# Patient Record
Sex: Female | Born: 1985 | Race: Black or African American | Hispanic: No | Marital: Single | Smoking: Never smoker
Health system: Southern US, Community
[De-identification: ages and names within clinical notes are randomized; demographics above are authoritative.]

## PROBLEM LIST (undated history)

## (undated) ENCOUNTER — Inpatient Hospital Stay (HOSPITAL_COMMUNITY): Payer: Self-pay

## (undated) DIAGNOSIS — A5901 Trichomonal vulvovaginitis: Secondary | ICD-10-CM

## (undated) DIAGNOSIS — K047 Periapical abscess without sinus: Secondary | ICD-10-CM

## (undated) DIAGNOSIS — O24419 Gestational diabetes mellitus in pregnancy, unspecified control: Secondary | ICD-10-CM

## (undated) DIAGNOSIS — R87629 Unspecified abnormal cytological findings in specimens from vagina: Secondary | ICD-10-CM

## (undated) HISTORY — PX: DILATION AND EVACUATION: SHX1459

## (undated) HISTORY — PX: COLPOSCOPY: SHX161

---

## 2004-02-09 ENCOUNTER — Emergency Department (HOSPITAL_COMMUNITY): Admission: EM | Admit: 2004-02-09 | Discharge: 2004-02-09 | Payer: Self-pay | Admitting: Emergency Medicine

## 2005-04-01 ENCOUNTER — Inpatient Hospital Stay (HOSPITAL_COMMUNITY): Admission: AD | Admit: 2005-04-01 | Discharge: 2005-04-01 | Payer: Self-pay | Admitting: Obstetrics & Gynecology

## 2005-04-07 ENCOUNTER — Inpatient Hospital Stay (HOSPITAL_COMMUNITY): Admission: AD | Admit: 2005-04-07 | Discharge: 2005-04-07 | Payer: Self-pay | Admitting: Obstetrics and Gynecology

## 2009-01-21 ENCOUNTER — Encounter: Admission: RE | Admit: 2009-01-21 | Discharge: 2009-01-21 | Payer: Self-pay | Admitting: Family Medicine

## 2009-10-28 ENCOUNTER — Emergency Department (HOSPITAL_COMMUNITY): Admission: EM | Admit: 2009-10-28 | Discharge: 2009-10-28 | Payer: Self-pay | Admitting: Emergency Medicine

## 2010-01-23 ENCOUNTER — Emergency Department (HOSPITAL_COMMUNITY): Admission: EM | Admit: 2010-01-23 | Discharge: 2010-01-24 | Payer: Self-pay | Admitting: Emergency Medicine

## 2010-01-25 ENCOUNTER — Emergency Department (HOSPITAL_COMMUNITY): Admission: EM | Admit: 2010-01-25 | Discharge: 2010-01-25 | Payer: Self-pay | Admitting: Emergency Medicine

## 2010-01-28 ENCOUNTER — Emergency Department (HOSPITAL_COMMUNITY): Admission: EM | Admit: 2010-01-28 | Discharge: 2010-01-28 | Payer: Self-pay | Admitting: Emergency Medicine

## 2010-02-26 ENCOUNTER — Encounter: Admission: RE | Admit: 2010-02-26 | Discharge: 2010-02-26 | Payer: Self-pay | Admitting: Family Medicine

## 2010-05-07 ENCOUNTER — Emergency Department (HOSPITAL_COMMUNITY): Admission: EM | Admit: 2010-05-07 | Discharge: 2010-05-07 | Payer: Self-pay | Admitting: Emergency Medicine

## 2010-10-18 ENCOUNTER — Encounter: Payer: Self-pay | Admitting: Family Medicine

## 2011-01-20 ENCOUNTER — Emergency Department (HOSPITAL_COMMUNITY)
Admission: EM | Admit: 2011-01-20 | Discharge: 2011-01-20 | Disposition: A | Discharge status: Home / Self Care | Payer: Self-pay | Attending: Emergency Medicine | Admitting: Emergency Medicine

## 2011-01-20 DIAGNOSIS — K644 Residual hemorrhoidal skin tags: Secondary | ICD-10-CM | POA: Insufficient documentation

## 2011-02-09 ENCOUNTER — Other Ambulatory Visit: Payer: Self-pay | Admitting: Family Medicine

## 2011-02-09 DIAGNOSIS — Z09 Encounter for follow-up examination after completed treatment for conditions other than malignant neoplasm: Secondary | ICD-10-CM

## 2011-03-19 ENCOUNTER — Ambulatory Visit
Admission: RE | Admit: 2011-03-19 | Discharge: 2011-03-19 | Disposition: A | Discharge status: Home / Self Care | Payer: Self-pay | Source: Ambulatory Visit | Attending: Family Medicine | Admitting: Family Medicine

## 2011-03-19 DIAGNOSIS — Z09 Encounter for follow-up examination after completed treatment for conditions other than malignant neoplasm: Secondary | ICD-10-CM

## 2011-04-06 ENCOUNTER — Other Ambulatory Visit (INDEPENDENT_AMBULATORY_CARE_PROVIDER_SITE_OTHER): Payer: Self-pay | Admitting: General Surgery

## 2011-04-06 DIAGNOSIS — Z1231 Encounter for screening mammogram for malignant neoplasm of breast: Secondary | ICD-10-CM

## 2011-06-25 ENCOUNTER — Encounter (HOSPITAL_COMMUNITY): Payer: Self-pay | Admitting: *Deleted

## 2011-06-25 ENCOUNTER — Inpatient Hospital Stay (HOSPITAL_COMMUNITY)
Admission: AD | Admit: 2011-06-25 | Discharge: 2011-06-26 | Disposition: A | Discharge status: Home / Self Care | Payer: Self-pay | Source: Ambulatory Visit | Attending: Obstetrics & Gynecology | Admitting: Obstetrics & Gynecology

## 2011-06-25 DIAGNOSIS — Z369 Encounter for antenatal screening, unspecified: Secondary | ICD-10-CM

## 2011-06-25 DIAGNOSIS — O219 Vomiting of pregnancy, unspecified: Secondary | ICD-10-CM

## 2011-06-25 DIAGNOSIS — O210 Mild hyperemesis gravidarum: Secondary | ICD-10-CM | POA: Insufficient documentation

## 2011-06-25 DIAGNOSIS — Z36 Encounter for antenatal screening of mother: Secondary | ICD-10-CM

## 2011-06-25 HISTORY — DX: Periapical abscess without sinus: K04.7

## 2011-06-25 LAB — URINALYSIS, ROUTINE W REFLEX MICROSCOPIC
Bilirubin Urine: NEGATIVE
Glucose, UA: NEGATIVE mg/dL
Hgb urine dipstick: NEGATIVE
Leukocytes, UA: NEGATIVE

## 2011-06-25 NOTE — ED Provider Notes (Signed)
History     CSN: [id number] Arrival date & time: 06/25/2011 10:29 PM  Chief Complaint  Patient presents with  . Abdominal Pain  . Nausea  . Dizziness    HPI Julia Owens is a 25 y.o. female who presents to [facility] for cramping abdominal pain, nausea and dizziness. LMP in August was normal, no period in Sept. Breast tenderness past several days. Stopped  OC in April. The history was provided by the patient.  Past Medical History  Diagnosis Date  . Abscessed tooth     current    History reviewed. No pertinent past surgical history.  No family history on file.  History  Substance Use Topics  . Smoking status: Never Smoker   . Smokeless tobacco: Never Used  . Alcohol Use: Yes     socially drinks    OB History    Grav Para Term Preterm Abortions TAB SAB Ect Mult Living   2 0 0 0 1 0 1 0 0 0       Review of Systems  Constitutional: Positive for chills and fatigue. Negative for fever and diaphoresis.  HENT: Negative for ear pain, congestion, sore throat, facial swelling, neck pain, neck stiffness, dental problem and sinus pressure.   Eyes: Negative for photophobia, pain and discharge.  Respiratory: Negative for cough, chest tightness and wheezing.   Cardiovascular: Negative.   Gastrointestinal: Positive for nausea and abdominal pain. Negative for vomiting, diarrhea and constipation.  Genitourinary: Positive for frequency. Negative for dysuria, flank pain and difficulty urinating.  Musculoskeletal: Positive for back pain. Negative for myalgias and gait problem.  Skin: Negative for color change and rash.  Neurological: Positive for light-headedness. Negative for dizziness, speech difficulty, weakness, numbness and headaches.  Psychiatric/Behavioral: Negative for confusion and agitation.    Allergies  Review of patient's allergies indicates no known allergies.  Home Medications  No current outpatient prescriptions on file.  BP 115/81  Pulse 94  Temp(Src) 99.1 F (37.3  C) (Oral)  Ht 5\' 3"  (1.6 m)  Wt 184 lb 6 oz (83.632 kg)  BMI 32.66 kg/m2  LMP 05/12/2011  Physical Exam  Nursing note and vitals reviewed. Constitutional: She is oriented to person, place, and time. She appears well-developed and well-nourished.  HENT:  Head: Normocephalic and atraumatic.  Eyes: EOM are normal.  Neck: Neck supple.  Pulmonary/Chest: Effort normal.  Abdominal: Soft. There is no tenderness.       Unable to reproduce the pain the patient has had.  Genitourinary:       White vaginal discharge. Cervix closed, long, no CMT, no adnexal mass or tenderness. Uterus slightly enlarged.  Musculoskeletal: Normal range of motion.  Neurological: She is alert and oriented to person, place, and time. No cranial nerve deficit.  Skin: Skin is warm and dry.    ED Course  Procedures US Ob Comp Less 14 Wks  06/26/2011  *RADIOLOGY REPORT*  Clinical Data: Abdominal pain and cramping.  Positive pregnancy test.  Estimated gestational age by LMP 6 weeks 3 days.  OBSTETRIC <14 WK ULTRASOUND  Technique:  Transabdominal ultrasound was performed for evaluation of the gestation as well as the maternal uterus and adnexal regions.  Comparison:  None.  Intrauterine gestational sac: The uterus is anteverted.  A single intrauterine gestational sac is visualized. Yolk sac: Demonstrated Embryo: Demonstrated Cardiac Activity: Demonstrated Heart Rate: 128 bpm  CRL:  10 mm  7w  1d           [place] EDC: 02/11/2012  Maternal uterus/Adnexae:  The right ovary is not visualized.  The left ovary is demonstrated and contains a hypoechoic structure measuring about 3.1 x 2.4 x 2.7 cm consistent with a hemorrhagic cyst.  No free pelvic fluid collections.  IMPRESSION: Single intrauterine gestation with crown-rump length consistent with estimated gestational age [redacted] weeks 1 day.  Original Report Authenticated By: Marlon Pel, M.D.   Results for orders placed during the hospital encounter of 06/25/11 (from the past 24 hour(s))    URINALYSIS, ROUTINE W REFLEX MICROSCOPIC     Status: Normal   Collection Time   06/25/11 11:15 PM      Component Value Range   Color, Urine YELLOW  YELLOW    Appearance CLEAR  CLEAR    Specific Gravity, Urine 1.025  1.005 - 1.030    pH 6.5  5.0 - 8.0    Glucose, UA NEGATIVE  NEGATIVE (mg/dL)   Hgb urine dipstick NEGATIVE  NEGATIVE    Bilirubin Urine NEGATIVE  NEGATIVE    Ketones, ur NEGATIVE  NEGATIVE (mg/dL)   Protein, ur NEGATIVE  NEGATIVE (mg/dL)   Urobilinogen, UA 0.2  0.0 - 1.0 (mg/dL)   Nitrite NEGATIVE  NEGATIVE    Leukocytes, UA NEGATIVE  NEGATIVE   POCT PREGNANCY, URINE     Status: Normal   Collection Time   06/25/11 11:27 PM      Component Value Range   Preg Test, Ur POSITIVE    WET PREP, GENITAL     Status: Abnormal   Collection Time   06/25/11 11:45 PM      Component Value Range   Yeast, Wet Prep NONE SEEN  NONE SEEN    Trich, Wet Prep NONE SEEN  NONE SEEN    Clue Cells, Wet Prep FEW (*) NONE SEEN    WBC, Wet Prep HPF POC MODERATE (*) NONE SEEN   CBC     Status: Abnormal   Collection Time   06/26/11 12:18 AM      Component Value Range   WBC 16.9 (*) 4.0 - 10.5 (K/uL)   RBC 4.29  3.87 - 5.11 (MIL/uL)   Hemoglobin 11.6 (*) 12.0 - 15.0 (g/dL)   HCT 16.1 (*) 09.6 - 46.0 (%)   MCV 76.7 (*) 78.0 - 100.0 (fL)   MCH 27.0  26.0 - 34.0 (pg)   MCHC 35.3  30.0 - 36.0 (g/dL)   RDW 04.5  40.9 - 81.1 (%)   Platelets 287  150 - 400 (K/uL)  DIFFERENTIAL     Status: Abnormal   Collection Time   06/26/11 12:18 AM      Component Value Range   Neutrophils Relative 76  43 - 77 (%)   Neutro Abs 12.8 (*) 1.7 - 7.7 (K/uL)   Lymphocytes Relative 18  12 - 46 (%)   Lymphs Abs 3.0  0.7 - 4.0 (K/uL)   Monocytes Relative 6  3 - 12 (%)   Monocytes Absolute 1.0  0.1 - 1.0 (K/uL)   Eosinophils Relative 0  0 - 5 (%)   Eosinophils Absolute 0.1  0.0 - 0.7 (K/uL)   Basophils Relative 0  0 - 1 (%)   Basophils Absolute 0.0  0.0 - 0.1 (K/uL)   Discussed elevated WBC with Dr. Marice Potter.   Assessment: 7 week 1 day IUP with cardiac activity   Nausea in early pregnancy  Plan:  Phenergan for nausea   Start prenatal care   Return for problems.  MDM  [place], [place] 06/26/11 [phone]

## 2011-06-25 NOTE — Progress Notes (Signed)
[name] NP in to see pt. Spec exam done and wet prep and GC/Chlam obtained. Pt tol well 

## 2011-06-25 NOTE — Progress Notes (Signed)
Pt states, " I've had pain in my low abdomen for 2 wks with white vaginal discharge that has been off and on for 2 wks. I've also had nausea and dizziness off and on for 2 wks."

## 2011-06-26 ENCOUNTER — Inpatient Hospital Stay (HOSPITAL_COMMUNITY): Payer: Self-pay

## 2011-06-26 LAB — CBC
HCT: 32.9 % — ABNORMAL LOW (ref 36.0–46.0)
Hemoglobin: 11.6 g/dL — ABNORMAL LOW (ref 12.0–15.0)
MCH: 27 pg (ref 26.0–34.0)
MCHC: 35.3 g/dL (ref 30.0–36.0)
MCV: 76.7 fL — ABNORMAL LOW (ref 78.0–100.0)
WBC: 16.9 10*3/uL — ABNORMAL HIGH (ref 4.0–10.5)

## 2011-06-26 LAB — DIFFERENTIAL
Basophils Absolute: 0 10*3/uL (ref 0.0–0.1)
Basophils Relative: 0 % (ref 0–1)
Eosinophils Absolute: 0.1 10*3/uL (ref 0.0–0.7)
Eosinophils Relative: 0 % (ref 0–5)
Neutro Abs: 12.8 10*3/uL — ABNORMAL HIGH (ref 1.7–7.7)

## 2011-06-26 LAB — WET PREP, GENITAL
Trich, Wet Prep: NONE SEEN
Yeast Wet Prep HPF POC: NONE SEEN

## 2011-06-26 LAB — HCG, QUANTITATIVE, PREGNANCY: hCG, Beta Chain, Quant, S: 84027 m[IU]/mL — ABNORMAL HIGH (ref <5)

## 2011-06-26 LAB — ABO/RH: ABO/RH(D): A POS

## 2011-06-26 LAB — GC/CHLAMYDIA PROBE AMP, GENITAL: Chlamydia, DNA Probe: NEGATIVE

## 2011-06-26 MED ORDER — PROMETHAZINE HCL 25 MG PO TABS: 25.0000 mg | ORAL_TABLET | Freq: Four times a day (QID) | ORAL | Status: DC | PRN

## 2011-06-26 NOTE — ED Notes (Signed)
0115 [name] NP in to discuss lab results and d/c plan with pt

## 2011-06-26 NOTE — Progress Notes (Signed)
Written and verbal d/c instructions given and understanding voiced. 

## 2011-06-26 NOTE — ED Notes (Signed)
Pt to and from u/s ambulatory

## 2011-06-26 NOTE — ED Provider Notes (Signed)
Agree with above note.  Julia Owens H. 06/26/2011 9:12 AM

## 2011-10-26 ENCOUNTER — Encounter (HOSPITAL_COMMUNITY): Payer: Self-pay | Admitting: Emergency Medicine

## 2011-10-26 ENCOUNTER — Emergency Department (HOSPITAL_COMMUNITY)
Admission: EM | Admit: 2011-10-26 | Discharge: 2011-10-27 | Disposition: A | Discharge status: Home / Self Care | Payer: Self-pay | Attending: Emergency Medicine | Admitting: Emergency Medicine

## 2011-10-26 DIAGNOSIS — L02219 Cutaneous abscess of trunk, unspecified: Secondary | ICD-10-CM | POA: Insufficient documentation

## 2011-10-26 DIAGNOSIS — L0291 Cutaneous abscess, unspecified: Secondary | ICD-10-CM

## 2011-10-26 NOTE — ED Notes (Signed)
Pt states she has an abscess to her lower back  Pt states it started on Thursday and it burst last night and she soaked in epsom salt  Today she states it is worse  Pt states she was seen at urgent care yesterday but the pain is worse today

## 2011-10-27 NOTE — ED Provider Notes (Signed)
History     CSN: [id number]  Arrival date & time 10/26/11  2102   First MD Initiated Contact with Patient 10/27/11 0034      Chief Complaint  Patient presents with  . Abscess    (Consider location/radiation/quality/duration/timing/severity/associated sxs/prior treatment) Patient is a 26 y.o. female presenting with abscess. The history is provided by the patient.  Abscess  This is a new problem. The current episode started less than one week ago. The onset was gradual. The problem occurs continuously. The problem has been gradually worsening. The abscess is present on the back. The problem is moderate. The abscess is characterized by painfulness and draining. Pertinent negatives include no fever. Recently, medical care has been given at another facility. Services received include medications given.   Pt has hx of abscesses in the past which have required drainage. Presents with abscess to R low back. States she was seen at urgent care yesterday and started on clindamycin for this; the abscess was not I+Ded. It spontaneously burst last evening and she has soaked it in epsom salt. She presents today as it is more painful.  Past Medical History  Diagnosis Date  . Abscessed tooth     current    History reviewed. No pertinent past surgical history.  History reviewed. No pertinent family history.  History  Substance Use Topics  . Smoking status: Never Smoker   . Smokeless tobacco: Never Used  . Alcohol Use: Yes     socially drinks    OB History    Grav Para Term Preterm Abortions TAB SAB Ect Mult Living   2 0 0 0 1 0 1 0 0 0       Review of Systems  Constitutional: Negative for fever.  Gastrointestinal: Negative for nausea.  Musculoskeletal: Negative for back pain.  Skin: Positive for wound.    Allergies  Review of patient's allergies indicates no known allergies.  Home Medications   Current Outpatient Rx  Name Route Sig Dispense Refill  . CEPHALEXIN 500 MG PO CAPS  Oral Take 500 mg by mouth 3 (three) times daily.    Marland Kitchen FERROUS FUMARATE 325 (106 FE) MG PO TABS Oral Take 1 tablet by mouth daily.    . IBUPROFEN 800 MG PO TABS Oral Take 800 mg by mouth every 8 (eight) hours as needed. Pain    . ADULT MULTIVITAMIN W/MINERALS CH Oral Take 1 tablet by mouth daily.    Marland Kitchen CLINDAMYCIN HCL 300 MG PO CAPS Oral Take 300 mg by mouth every 6 (six) hours.        BP 133/82  Pulse 109  Temp(Src) 99 F (37.2 C) (Oral)  Resp 20  SpO2 99%  LMP 10/26/2011  Breastfeeding? Unknown  Physical Exam  Vitals reviewed. Constitutional: She is oriented to person, place, and time. She appears well-developed and well-nourished. No distress.  HENT:  Head: Normocephalic and atraumatic.  Neck: Normal range of motion.  Cardiovascular: Normal rate.   Pulmonary/Chest: Effort normal.  Musculoskeletal: Normal range of motion.  Neurological: She is alert and oriented to person, place, and time.  Skin: Skin is warm and dry. She is not diaphoretic.       Abscess draining purulent material to R low back; fluctuant and painful to palpation; no overlying cellulitis  Psychiatric: She has a normal mood and affect.    ED Course  Procedures (including critical care time)  INCISION AND DRAINAGE Performed by: Grant Fontana Consent: Verbal consent obtained. Risks and benefits: risks, benefits and alternatives  were discussed Type: abscess  Body area: R lower back  Anesthesia: local infiltration  Local anesthetic: lidocaine 2% with epinephrine  Anesthetic total: 4 ml  Complexity: complex Blunt dissection to break up loculations  Drainage: purulent  Drainage amount: moderate  Packing material: 1/4 in iodoform gauze  Patient tolerance: Patient tolerated the procedure well with no immediate complications.     Labs Reviewed - No data to display No results found.   1. Abscess       MDM  Pt with abscess to R low back which has been draining. Incision and drainage  with blunt dissection undertaken to facilitate further drainage. Wound packed. Pt instructed to finish clindamycin as rxed at urgent care and to return in 2 days for wound recheck. Return precautions discussed.       Grant Fontana, [place] 10/28/11 1159

## 2011-10-29 NOTE — ED Provider Notes (Signed)
Medical screening examination/treatment/procedure(s) were performed by non-physician practitioner and as supervising physician I was immediately available for consultation/collaboration.  Loren Racer, MD 10/29/11 [phone]

## 2012-01-09 ENCOUNTER — Emergency Department (HOSPITAL_COMMUNITY)
Admission: EM | Admit: 2012-01-09 | Discharge: 2012-01-10 | Disposition: A | Discharge status: Home / Self Care | Payer: Self-pay | Attending: Emergency Medicine | Admitting: Emergency Medicine

## 2012-01-09 DIAGNOSIS — L0390 Cellulitis, unspecified: Secondary | ICD-10-CM

## 2012-01-09 DIAGNOSIS — L02219 Cutaneous abscess of trunk, unspecified: Secondary | ICD-10-CM | POA: Insufficient documentation

## 2012-01-09 DIAGNOSIS — K047 Periapical abscess without sinus: Secondary | ICD-10-CM | POA: Insufficient documentation

## 2012-01-10 ENCOUNTER — Encounter (HOSPITAL_COMMUNITY): Payer: Self-pay

## 2012-01-10 MED ORDER — AMOXICILLIN-POT CLAVULANATE 875-125 MG PO TABS: 1.0000 | ORAL_TABLET | Freq: Two times a day (BID) | ORAL | Status: AC

## 2012-01-10 MED ORDER — IBUPROFEN 600 MG PO TABS: 600.0000 mg | ORAL_TABLET | Freq: Four times a day (QID) | ORAL | Status: AC | PRN

## 2012-01-10 NOTE — Discharge Instructions (Signed)

## 2012-01-10 NOTE — ED Provider Notes (Signed)
History     CSN: [id number]  Arrival date & time 01/09/12  2354   First MD Initiated Contact with Patient 01/10/12 [phone]      Chief Complaint  Patient presents with  . Cellulitis    (Consider location/radiation/quality/duration/timing/severity/associated sxs/prior treatment) HPI Comments: Patient here after having a new belly button rings placed 2 days ago. States increase in redness surrounding the area and has noticed drainage from the area. She went back to the piercer who removed the ring, and told her to use sea. salt and water to wash the wound at home. She reports continued periodic drainage on the site, redness, swelling. Denies fevers chills, nausea, vomiting.  Patient is a 26 y.o. female presenting with rash. The history is provided by the patient. No language interpreter was used.  Rash  This is a new problem. The current episode started more than 2 days ago. The problem has been gradually worsening. Associated with: new body piercing. There has been no fever. Affected Location: umbilicus. The pain is at a severity of 5/10. The pain is moderate. The pain has been constant since onset. Associated symptoms include itching, pain and weeping. Pertinent negatives include no blisters. She has tried OTC analgesics for the symptoms. The treatment provided no relief.    Past Medical History  Diagnosis Date  . Abscessed tooth     current    No past surgical history on file.  History reviewed. No pertinent family history.  History  Substance Use Topics  . Smoking status: Never Smoker   . Smokeless tobacco: Never Used  . Alcohol Use: Yes     socially drinks    OB History    Grav Para Term Preterm Abortions TAB SAB Ect Mult Living   2 0 0 0 1 0 1 0 0 0       Review of Systems  Skin: Positive for itching, rash and wound.  All other systems reviewed and are negative.    Allergies  Review of patient's allergies indicates no known allergies.  Home Medications  No  current outpatient prescriptions on file.  BP 129/75  Pulse 99  Temp(Src) 98.5 F (36.9 C) (Oral)  Resp 18  Ht 5\' 3"  (1.6 m)  Wt 193 lb 12.8 oz (87.907 kg)  BMI 34.33 kg/m2  SpO2 99%  LMP 12/17/2011  Physical Exam  Nursing note and vitals reviewed. Constitutional: She is oriented to person, place, and time. She appears well-developed and well-nourished. No distress.  HENT:  Head: Normocephalic and atraumatic.  Right Ear: External ear normal.  Left Ear: External ear normal.  Nose: Nose normal.  Mouth/Throat: No oropharyngeal exudate.  Eyes: Conjunctivae are normal. Pupils are equal, round, and reactive to light. No scleral icterus.  Neck: Normal range of motion. Neck supple.  Cardiovascular: Normal rate, regular rhythm and normal heart sounds.  Exam reveals no gallop and no friction rub.   No murmur heard. Pulmonary/Chest: Effort normal and breath sounds normal. No respiratory distress. She has no wheezes. She has no rales. She exhibits no tenderness.  Abdominal: Soft. Bowel sounds are normal. She exhibits no distension and no mass. There is tenderness. There is no rebound and no guarding.       4 cm area of induration surrounding umbilicus without fluctuance. No drainage at this time, erythema  Musculoskeletal: Normal range of motion. She exhibits no edema and no tenderness.  Lymphadenopathy:    She has no cervical adenopathy.  Neurological: She is alert and oriented to person, place,  and time. No cranial nerve deficit.  Skin: Skin is warm and dry. No rash noted. There is erythema. No pallor.  Psychiatric: She has a normal mood and affect. Her behavior is normal. Judgment and thought content normal.    ED Course  Procedures (including critical care time)  Labs Reviewed - No data to display No results found.   Cellulitis    MDM  Patient with cellulitis and no evidence of abscess at this time. Plan to place patient on antibiotics, she will followup with me on Thursday  at [facility] for re-evaluation.        Izola Price [place], [place] 01/10/12 [phone]

## 2012-01-10 NOTE — ED Provider Notes (Signed)
Medical screening examination/treatment/procedure(s) were performed by non-physician practitioner and as supervising physician I was immediately available for consultation/collaboration.  Olivia Mackie, MD 01/10/12 [phone]

## 2012-01-10 NOTE — ED Notes (Signed)
Pt states placed new bellybutton ring in old piercing. C/o swelling pain and heat to the area for one week. Used sea salt and water and wound wash at home to clean.

## 2012-01-13 ENCOUNTER — Emergency Department (HOSPITAL_COMMUNITY)
Admission: EM | Admit: 2012-01-13 | Discharge: 2012-01-14 | Disposition: A | Discharge status: Home / Self Care | Payer: Self-pay | Attending: Emergency Medicine | Admitting: Emergency Medicine

## 2012-01-13 DIAGNOSIS — Z4801 Encounter for change or removal of surgical wound dressing: Secondary | ICD-10-CM | POA: Insufficient documentation

## 2012-01-13 DIAGNOSIS — Z5189 Encounter for other specified aftercare: Secondary | ICD-10-CM

## 2012-01-14 ENCOUNTER — Encounter (HOSPITAL_COMMUNITY): Payer: Self-pay | Admitting: *Deleted

## 2012-01-14 NOTE — ED Notes (Signed)
Patient was here Sunday night for a skin infection and was told to come back for a recheck today.

## 2012-01-14 NOTE — ED Provider Notes (Signed)
Medical screening examination/treatment/procedure(s) were performed by non-physician practitioner and as supervising physician I was immediately available for consultation/collaboration.  Doug Sou, MD 01/14/12 [phone]

## 2012-01-14 NOTE — Discharge Instructions (Signed)
Scar Minimization You will have a scar anytime you have surgery and a cut is made in the skin or you have something removed from your skin (mole, skin cancer, cyst). Although scars are unavoidable following surgery, there are ways to minimize their appearance. It is important to follow all the instructions you receive from your caregiver about wound care. How your wound heals will influence the appearance of your scar. If you do not follow the wound care instructions as directed, complications such as infection may occur. Wound instructions include keeping the wound clean, moist, and not letting the wound form a scab. Some people form scars that are raised and lumpy (hypertrophic) or larger than the initial wound (keloidal). HOME CARE INSTRUCTIONS   Follow wound care instructions as directed.   Keep the wound clean by washing it with soap and water.   Keep the wound moist with provided antibiotic cream or petroleum jelly until completely healed. Moisten twice a day for about 2 weeks.   Get stitches (sutures) taken out at the scheduled time.   Avoid touching or manipulating your wound unless needed. Wash your hands thoroughly before and after touching your wound.   Follow all restrictions such as limits on exercise or work. This depends on where your scar is located.   Keep the scar protected from sunburn. Cover the scar with sunscreen/sunblock with SPF 30 or higher.   Gently massage the scar using a circular motion to help minimize the appearance of the scar. Do this only after the wound has closed and all the sutures have been removed.   For hypertrophic or keloidal scars, there are several ways to treat and minimize their appearance. Methods include compression therapy, intralesional corticosteroids, laser therapy, or surgery. These methods are performed by your caregiver.  Remember that the scar may appear lighter or darker than your normal skin color. This difference in color should even  out with time. SEEK MEDICAL CARE IF:   You have a fever.   You develop signs of infection such as pain, redness, pus, and warmth.   You have questions or concerns.  Document Released: 03/03/2010 Document Revised: 09/02/2011 Document Reviewed: 03/03/2010 [employer] Patient Information 2012 [employer]. 

## 2012-01-14 NOTE — ED Provider Notes (Signed)
History     CSN: [id number]  Arrival date & time 01/13/12  2343   First MD Initiated Contact with Patient 01/14/12 0047      Chief Complaint  Patient presents with  . Recurrent Skin Infections    (Consider location/radiation/quality/duration/timing/severity/associated sxs/prior treatment) HPI Comments: Patient returns for re-evaluation with me for umbilical piercing cellulitis vs early abscess - there is no longer any redness to the area - there is hypertropic skin to the area which has a keloid appearance.  Patient denies fever, chills, drainage from the area, pain.  Patient is a 26 y.o. female presenting with wound check. The history is provided by the patient. No language interpreter was used.  Wound Check  She was treated in the ED 3 to 5 days ago. Previous treatment in the ED includes wound cleansing or irrigation and oral antibiotics. Treatments since wound repair include oral antibiotics. The fever has been present for 3 to 4 days. Her temperature was unmeasured prior to arrival. There has been no drainage from the wound. There is no redness present. There is no swelling present. The pain has no pain. She has no difficulty moving the affected extremity or digit.    Past Medical History  Diagnosis Date  . Abscessed tooth     current    History reviewed. No pertinent past surgical history.  History reviewed. No pertinent family history.  History  Substance Use Topics  . Smoking status: Never Smoker   . Smokeless tobacco: Never Used  . Alcohol Use: Yes     socially drinks    OB History    Grav Para Term Preterm Abortions TAB SAB Ect Mult Living   2 0 0 0 1 0 1 0 0 0       Review of Systems  Skin: Positive for wound.  All other systems reviewed and are negative.    Allergies  Review of patient's allergies indicates no known allergies.  Home Medications   Current Outpatient Rx  Name Route Sig Dispense Refill  . AMOXICILLIN-POT CLAVULANATE 875-125 MG PO TABS  Oral Take 1 tablet by mouth every 12 (twelve) hours. 14 tablet 0  . IBUPROFEN 600 MG PO TABS Oral Take 1 tablet (600 mg total) by mouth every 6 (six) hours as needed for pain. 30 tablet 0    BP 135/85  Pulse 93  Temp(Src) 97.9 F (36.6 C) (Oral)  Resp 18  SpO2 100%  LMP 12/17/2011  Physical Exam  Nursing note and vitals reviewed. Constitutional: She is oriented to person, place, and time. She appears well-developed and well-nourished. No distress.  HENT:  Head: Normocephalic and atraumatic.  Right Ear: External ear normal.  Left Ear: External ear normal.  Nose: Nose normal.  Mouth/Throat: Oropharynx is clear and moist. No oropharyngeal exudate.  Eyes: Conjunctivae are normal. Pupils are equal, round, and reactive to light. No scleral icterus.  Neck: Normal range of motion. Neck supple.  Cardiovascular: Normal rate, regular rhythm and normal heart sounds.  Exam reveals no gallop and no friction rub.   No murmur heard. Pulmonary/Chest: Effort normal and breath sounds normal. No respiratory distress. She has no wheezes. She has no rales. She exhibits no tenderness.  Abdominal: Soft. Bowel sounds are normal. She exhibits no distension and no mass. There is no tenderness. There is no rebound and no guarding.       Hypertrophy of skin to the superior portion of the umbilicus, no erythema or drainage noted - mild scaring with keloid formation.  Musculoskeletal: Normal range of motion. She exhibits no edema and no tenderness.  Lymphadenopathy:    She has no cervical adenopathy.  Neurological: She is alert and oriented to person, place, and time. No cranial nerve deficit.  Skin: Skin is warm and dry. No rash noted. No erythema. No pallor.  Psychiatric: She has a normal mood and affect. Her behavior is normal. Judgment and thought content normal.    ED Course  Procedures (including critical care time)  Labs Reviewed - No data to display No results found.   Wound re-check    MDM    Patient remains of antibiotic for cellulitis = no evidence of cellulitis or abscess formation.  Area with hypertrophy of the skin with keloid formation - she will continue current course and return if needed.        Izola Price [place], [place] 01/14/12 [phone]

## 2012-03-14 DIAGNOSIS — L03319 Cellulitis of trunk, unspecified: Secondary | ICD-10-CM | POA: Insufficient documentation

## 2012-03-14 DIAGNOSIS — L02219 Cutaneous abscess of trunk, unspecified: Secondary | ICD-10-CM | POA: Insufficient documentation

## 2012-03-15 ENCOUNTER — Emergency Department (HOSPITAL_COMMUNITY)
Admission: EM | Admit: 2012-03-15 | Discharge: 2012-03-15 | Disposition: A | Discharge status: Home / Self Care | Payer: Self-pay | Attending: Emergency Medicine | Admitting: Emergency Medicine

## 2012-03-15 ENCOUNTER — Encounter (HOSPITAL_COMMUNITY): Payer: Self-pay | Admitting: *Deleted

## 2012-03-15 DIAGNOSIS — L0291 Cutaneous abscess, unspecified: Secondary | ICD-10-CM

## 2012-03-15 MED ORDER — CLINDAMYCIN HCL 150 MG PO CAPS: 150.0000 mg | ORAL_CAPSULE | Freq: Four times a day (QID) | ORAL | Status: AC

## 2012-03-15 MED ORDER — CLINDAMYCIN HCL 300 MG PO CAPS
300.0000 mg | ORAL_CAPSULE | Freq: Once | ORAL | Status: AC
  Administered 2012-03-15: 300 mg via ORAL
  Filled 2012-03-15: qty 1

## 2012-03-15 NOTE — Discharge Instructions (Signed)
Please return to the ER in 24 - 48 hours if symptoms are getting worse as it may need to be opened up more.   Abscess An abscess (boil or furuncle) is an infected area that contains a collection of pus.  SYMPTOMS Signs and symptoms of an abscess include pain, tenderness, redness, or hardness. You may feel a moveable soft area under your skin. An abscess can occur anywhere in the body.  TREATMENT  A surgical cut (incision) may be made over your abscess to drain the pus. Gauze may be packed into the space or a drain may be looped through the abscess cavity (pocket). This provides a drain that will allow the cavity to heal from the inside outwards. The abscess may be painful for a few days, but should feel much better if it was drained.  Your abscess, if seen early, may not have localized and may not have been drained. If not, another appointment may be required if it does not get better on its own or with medications. HOME CARE INSTRUCTIONS   Only take over-the-counter or prescription medicines for pain, discomfort, or fever as directed by your caregiver.   Take your antibiotics as directed if they were prescribed. Finish them even if you start to feel better.   Keep the skin and clothes clean around your abscess.   If the abscess was drained, you will need to use gauze dressing to collect any draining pus. Dressings will typically need to be changed 3 or more times a day.   The infection may spread by skin contact with others. Avoid skin contact as much as possible.   Practice good hygiene. This includes regular hand washing, cover any draining skin lesions, and do not share personal care items.   If you participate in sports, do not share athletic equipment, towels, whirlpools, or personal care items. Shower after every practice or tournament.   If a draining area cannot be adequately covered:   Do not participate in sports.   Children should not participate in day care until the wound  has healed or drainage stops.   If your caregiver has given you a follow-up appointment, it is very important to keep that appointment. Not keeping the appointment could result in a much worse infection, chronic or permanent injury, pain, and disability. If there is any problem keeping the appointment, you must call back to this facility for assistance.  SEEK MEDICAL CARE IF:   You develop increased pain, swelling, redness, drainage, or bleeding in the wound site.   You develop signs of generalized infection including muscle aches, chills, fever, or a general ill feeling.   You have an oral temperature above 102 F (38.9 C).  MAKE SURE YOU:   Understand these instructions.   Will watch your condition.   Will get help right away if you are not doing well or get worse.  Document Released: 06/23/2005 Document Revised: 09/02/2011 Document Reviewed: 04/16/2008 [facility] Patient Information 2012 [place], [place].

## 2012-03-15 NOTE — ED Provider Notes (Signed)
History     CSN: [id number]  Arrival date & time 03/14/12  2325   First MD Initiated Contact with Patient 03/15/12 [phone]      Chief Complaint  Patient presents with  . Abscess    (Consider location/radiation/quality/duration/timing/severity/associated sxs/prior treatment) HPI  Patient presents to the ER with complaints of abscess to right side of abdomen. It has already opened and begun to drain. She came to the ER for abx and informs me that she does not want to have the abscess cut open anymore. She denies fevers, chills, weakness, nausea, vomiting, diarrhea. She admits to having recurrent abscesses. Rash developed on friday  Past Medical History  Diagnosis Date  . Abscessed tooth     current    History reviewed. No pertinent past surgical history.  History reviewed. No pertinent family history.  History  Substance Use Topics  . Smoking status: Never Smoker   . Smokeless tobacco: Never Used  . Alcohol Use: Yes     socially drinks    OB History    Grav Para Term Preterm Abortions TAB SAB Ect Mult Living   2 0 0 0 1 0 1 0 0 0       Review of Systems   HEENT: denies blurry vision or change in hearing PULMONARY: Denies difficulty breathing and SOB CARDIAC: denies chest pain or heart palpitations MUSCULOSKELETAL:  denies being unable to ambulate ABDOMEN AL: denies abdominal pain GU: denies loss of bowel or urinary control NEURO: denies numbness and tingling in extremities     Allergies  Review of patient's allergies indicates no known allergies.  Home Medications   Current Outpatient Rx  Name Route Sig Dispense Refill  . CLINDAMYCIN HCL 150 MG PO CAPS Oral Take 1 capsule (150 mg total) by mouth every 6 (six) hours. 28 capsule 0    BP 129/71  Pulse 84  Temp 98.2 F (36.8 C) (Oral)  Resp 16  SpO2 100%  LMP 02/17/2012  Physical Exam  Nursing note and vitals reviewed. Constitutional: She appears well-developed and well-nourished. No distress.    HENT:  Head: Normocephalic and atraumatic.  Eyes: Pupils are equal, round, and reactive to light.  Neck: Normal range of motion. Neck supple.  Cardiovascular: Normal rate and regular rhythm.   Pulmonary/Chest: Effort normal.  Abdominal: Soft.         Small 2 cm x 3 cm abscess to abdomen that is open and draining purulent puss. Mild surroudning cellulitis noted  Neurological: She is alert.  Skin: Skin is warm and dry.    ED Course  Procedures (including critical care time)  Labs Reviewed - No data to display No results found.   1. Abscess       MDM  I have informed patient that since abscess is already draining we can try warm moist compresses, her expressing the wound multiple times a day and Clindamycin on the absces as she does not want to have it lanced. I have informed her that if it continues to get worse in 24-48 hours she needs to return to the ED as if it continues to get worse it may become surgical or life threatening she agreed.  Rx: Clindamycin  Pt has been advised of the symptoms that warrant their return to the ED. Patient has voiced understanding and has agreed to follow-up with the PCP or specialist.        Dorthula Matas, PA 03/15/12 [phone]

## 2012-03-15 NOTE — ED Notes (Signed)
Pt reports abscess to abd noted on Friday, continued to get worse - pt states she has of same "and just needs a prescription for antibiotics."

## 2012-03-15 NOTE — ED Provider Notes (Signed)
Medical screening examination/treatment/procedure(s) were performed by non-physician practitioner and as supervising physician I was immediately available for consultation/collaboration.   Ygnacio Fecteau L Belita Warsame, MD 03/15/12 0809 

## 2012-03-15 NOTE — ED Notes (Addendum)
Pt presents with a puss filled abscess to her abdomen with erythema, tender to palpation, and warmth. Pt denies fevers, and n/v.

## 2012-04-12 ENCOUNTER — Emergency Department (HOSPITAL_COMMUNITY)
Admission: EM | Admit: 2012-04-12 | Discharge: 2012-04-12 | Disposition: A | Discharge status: Home / Self Care | Payer: Self-pay | Attending: Emergency Medicine | Admitting: Emergency Medicine

## 2012-04-12 ENCOUNTER — Encounter (HOSPITAL_COMMUNITY): Payer: Self-pay | Admitting: Emergency Medicine

## 2012-04-12 DIAGNOSIS — B86 Scabies: Secondary | ICD-10-CM | POA: Insufficient documentation

## 2012-04-12 MED ORDER — PERMETHRIN 5 % EX CREA: TOPICAL_CREAM | CUTANEOUS | Status: AC

## 2012-04-12 NOTE — ED Provider Notes (Signed)
History     CSN: [id number]  Arrival date & time 04/12/12  1810   First MD Initiated Contact with Patient 04/12/12 1823      Chief Complaint  Patient presents with  . Rash    (Consider location/radiation/quality/duration/timing/severity/associated sxs/prior treatment) HPI Patient resists emergency department with rash discovering her arms, chest, abdomen, and legs.  Patient, states the rash is in between her fingers as well.  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  Patient, states there is intense itching associated with the rash.  Patient has fevers, nausea, vomiting, or diarrhea.  Patient, states she has not tried anything for the rash.  This has been present over the last month. Past Medical History  Diagnosis Date  . Abscessed tooth     current    No past surgical history on file.  No family history on file.  History  Substance Use Topics  . Smoking status: Never Smoker   . Smokeless tobacco: Never Used  . Alcohol Use: Yes     socially drinks    OB History    Grav Para Term Preterm Abortions TAB SAB Ect Mult Living   2 0 0 0 1 0 1 0 0 0       Review of Systems All other systems negative except as documented in the HPI. All pertinent positives and negatives as reviewed in the HPI.  Allergies  Review of patient's allergies indicates no known allergies.  Home Medications   Current Outpatient Rx  Name Route Sig Dispense Refill  . ADULT MULTIVITAMIN W/MINERALS CH Oral Take 1 tablet by mouth daily.      BP 122/78  Pulse 91  Temp 98.6 F (37 C) (Oral)  Resp 16  SpO2 99%  LMP 04/06/2012  Physical Exam  Constitutional: She appears well-developed and well-nourished. No distress.  Skin: Rash noted.       She was fine red rash that covers her arms, chest, abdomen, back, and legs.  The area in between her fingers also has rash.      ED Course  Procedures (including critical care time)  The patient's  husband also has a rash that started.  Patient thought this was some sort of bed bugs or fleas.  We will treat for scabies based on the appearance of rash in the distribution.   MDM          Carlyle Dolly, PA-C 04/12/12 [phone]

## 2012-04-12 NOTE — ED Notes (Signed)
Pt has small vesicular rash on arms, legs and trunk. Rash is itchy. Pt states this rash has gone on since Jan, but is worse now.

## 2012-04-13 NOTE — ED Provider Notes (Signed)
Medical screening examination/treatment/procedure(s) were performed by non-physician practitioner and as supervising physician I was immediately available for consultation/collaboration.  Adiel Erney R. Marquet Faircloth, MD 04/13/12 0028 

## 2012-10-06 ENCOUNTER — Emergency Department (HOSPITAL_COMMUNITY)
Admission: EM | Admit: 2012-10-06 | Discharge: 2012-10-07 | Disposition: A | Discharge status: Home / Self Care | Payer: Self-pay | Attending: Emergency Medicine | Admitting: Emergency Medicine

## 2012-10-06 ENCOUNTER — Encounter (HOSPITAL_COMMUNITY): Payer: Self-pay | Admitting: Emergency Medicine

## 2012-10-06 DIAGNOSIS — N390 Urinary tract infection, site not specified: Secondary | ICD-10-CM | POA: Insufficient documentation

## 2012-10-06 DIAGNOSIS — Y929 Unspecified place or not applicable: Secondary | ICD-10-CM | POA: Insufficient documentation

## 2012-10-06 DIAGNOSIS — Z79899 Other long term (current) drug therapy: Secondary | ICD-10-CM | POA: Insufficient documentation

## 2012-10-06 DIAGNOSIS — Y9389 Activity, other specified: Secondary | ICD-10-CM | POA: Insufficient documentation

## 2012-10-06 DIAGNOSIS — Z3202 Encounter for pregnancy test, result negative: Secondary | ICD-10-CM | POA: Insufficient documentation

## 2012-10-06 DIAGNOSIS — T148XXA Other injury of unspecified body region, initial encounter: Secondary | ICD-10-CM | POA: Insufficient documentation

## 2012-10-06 DIAGNOSIS — X58XXXA Exposure to other specified factors, initial encounter: Secondary | ICD-10-CM | POA: Insufficient documentation

## 2012-10-06 DIAGNOSIS — N9089 Other specified noninflammatory disorders of vulva and perineum: Secondary | ICD-10-CM | POA: Insufficient documentation

## 2012-10-06 DIAGNOSIS — Z8719 Personal history of other diseases of the digestive system: Secondary | ICD-10-CM | POA: Insufficient documentation

## 2012-10-06 NOTE — ED Notes (Signed)
Pt alert, arrives from home, c/o pain to left groin/vagina, onset was last week, seen PCP, instructed to use warm compress, f/u as needed, resp even unlabored, skin pwd

## 2012-10-07 LAB — WET PREP, GENITAL: Clue Cells Wet Prep HPF POC: NONE SEEN

## 2012-10-07 LAB — POCT PREGNANCY, URINE: Preg Test, Ur: NEGATIVE

## 2012-10-07 LAB — URINE MICROSCOPIC-ADD ON

## 2012-10-07 LAB — URINALYSIS, ROUTINE W REFLEX MICROSCOPIC
Glucose, UA: NEGATIVE mg/dL
Ketones, ur: 15 mg/dL — AB
Nitrite: POSITIVE — AB
Specific Gravity, Urine: 1.035 — ABNORMAL HIGH (ref 1.005–1.030)
pH: 5.5 (ref 5.0–8.0)

## 2012-10-07 MED ORDER — IBUPROFEN 800 MG PO TABS: 800.0000 mg | ORAL_TABLET | Freq: Three times a day (TID) | ORAL | Status: DC

## 2012-10-07 MED ORDER — CEPHALEXIN 500 MG PO CAPS: 1000.0000 mg | ORAL_CAPSULE | Freq: Two times a day (BID) | ORAL | Status: DC

## 2012-10-07 NOTE — ED Notes (Signed)
Pt states she has had pain for over a week and that she seen the health clinic for a cyst that is on her labia and she was told to hold warm compresses on it but she states it is more painful and that it feels bigger.  Pt is alert and oriented in NAD.

## 2012-10-07 NOTE — ED Provider Notes (Signed)
History     CSN: [id number]  Arrival date & time 10/06/12  2344   First MD Initiated Contact with Patient 10/07/12 [phone]      Chief Complaint  Patient presents with  . Groin Pain    (Consider location/radiation/quality/duration/timing/severity/associated sxs/prior treatment) HPI History provided by pt.   Pt is a poor historian.  She reports pain in left groin for the past several days.  No associated fever, urinary sx, low back pain or skin changes but has had mild pain on entire left side of abdomen.  Denies trauma.  Also c/o cystic lesion on left vulvar region.  Mildly painful.  Has been applying warm compresses but no relief of sx.   Has had vaginal discharge recently, for which she is on flagyl to treat BV.   Past Medical History  Diagnosis Date  . Abscessed tooth     current    History reviewed. No pertinent past surgical history.  No family history on file.  History  Substance Use Topics  . Smoking status: Never Smoker   . Smokeless tobacco: Never Used  . Alcohol Use: Yes     Comment: socially drinks    OB History    Grav Para Term Preterm Abortions TAB SAB Ect Mult Living   2 0 0 0 1 0 1 0 0 0       Review of Systems  All other systems reviewed and are negative.    Allergies  Review of patient's allergies indicates no known allergies.  Home Medications   Current Outpatient Rx  Name  Route  Sig  Dispense  Refill  . METRONIDAZOLE 500 MG PO TABS   Oral   Take 500 mg by mouth 3 (three) times daily.         . ADULT MULTIVITAMIN W/MINERALS CH   Oral   Take 1 tablet by mouth daily.           BP 129/78  Pulse 87  Temp 98.2 F (36.8 C) (Oral)  Resp 16  SpO2 100%  LMP 09/23/2012  Physical Exam  Nursing note and vitals reviewed. Constitutional: She is oriented to person, place, and time. She appears well-developed and well-nourished. No distress.  HENT:  Head: Normocephalic and atraumatic.  Eyes:       Normal appearance  Neck: Normal range  of motion.  Cardiovascular: Normal rate and regular rhythm.   Pulmonary/Chest: Effort normal and breath sounds normal. No respiratory distress.  Abdominal: Soft. Bowel sounds are normal. She exhibits no distension and no mass. There is no rebound and no guarding.       Slight, diffuse left-sided abd tenderness on initial exam but entire abdomen non-tender on two subsequent exams.   Genitourinary:       No CVA tenderness.  Nml external genitalia; no visible/palpable mass/lesion or other skin changes.  Mild tenderness left labia minor.  No vaginal discharge/bleeding.  Cervix closed and appears nml.  No adnexal or cervical motion tenderess.    Musculoskeletal: Normal range of motion.       Mild tenderness proximal inner thigh.  No pain w/ passive hip ROM.    Neurological: She is alert and oriented to person, place, and time.  Skin: Skin is warm and dry. No rash noted.  Psychiatric: She has a normal mood and affect. Her behavior is normal.    ED Course  Procedures (including critical care time)  Labs Reviewed  WET PREP, GENITAL - Abnormal; Notable for the following:  WBC, Wet Prep HPF POC FEW (*)     All other components within normal limits  URINALYSIS, ROUTINE W REFLEX MICROSCOPIC - Abnormal; Notable for the following:    Color, Urine AMBER (*)  BIOCHEMICALS MAY BE AFFECTED BY COLOR   APPearance CLOUDY (*)     Specific Gravity, Urine 1.035 (*)     Bilirubin Urine SMALL (*)     Ketones, ur 15 (*)     Nitrite POSITIVE (*)     Leukocytes, UA MODERATE (*)     All other components within normal limits  URINE MICROSCOPIC-ADD ON - Abnormal; Notable for the following:    Squamous Epithelial / LPF MANY (*)     Bacteria, UA FEW (*)     All other components within normal limits  POCT PREGNANCY, URINE  GC/CHLAMYDIA PROBE AMP  URINE CULTURE   No results found.   1. Muscle strain   2. Urinary tract infection       MDM  26yo healthy F presents w/ non-traumatic left groin pain.   Suspect muscle strain. Will treat symptomatically w/ rest, ice or heat and NSAID.  Pt has a UTI, likely an incidental finding.  Prescribed keflex.  Also c/o mildly painful left vulvar lesion; [facility] diagnosed her w/ cyst and recommended warm compresses 5 days ago.  Lesion is not visible/palpable on exam, though pt does have mild tenderness of left labia minor.  Suspect that cyst or abscess has drained.  Pt has been reassured and will be referred to [facility] for recurrent sx.        Otilio Miu, PA-C 10/07/12 [phone]

## 2012-10-08 LAB — URINE CULTURE: Culture: NO GROWTH

## 2012-10-08 NOTE — ED Provider Notes (Signed)
Medical screening examination/treatment/procedure(s) were performed by non-physician practitioner and as supervising physician I was immediately available for consultation/collaboration.  Nicholi Ghuman, MD 10/08/12 2305 

## 2013-01-05 ENCOUNTER — Encounter: Payer: Self-pay | Admitting: Obstetrics & Gynecology

## 2013-02-01 ENCOUNTER — Ambulatory Visit (INDEPENDENT_AMBULATORY_CARE_PROVIDER_SITE_OTHER): Payer: Self-pay | Admitting: Obstetrics & Gynecology

## 2013-02-01 ENCOUNTER — Encounter: Payer: Self-pay | Admitting: Obstetrics & Gynecology

## 2013-02-01 VITALS — BP 136/91 | HR 92 | Ht 62.0 in | Wt 182.0 lb

## 2013-02-01 DIAGNOSIS — N750 Cyst of Bartholin's gland: Secondary | ICD-10-CM

## 2013-02-02 NOTE — Patient Instructions (Signed)
Return to clinic for any scheduled appointments or for any gynecologic concerns as needed.   

## 2013-02-02 NOTE — Progress Notes (Signed)
GYNECOLOGY CLINIC PROGRESS NOTE  History:  27 y.o. 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  No current cyst or pain. No other GYN concerns  The following portions of the patient's history were reviewed and updated as appropriate: allergies, current medications, past family history, past medical history, past social history, past surgical history and problem list.  Review of Systems:  A comprehensive review of systems was negative.  Objective:  Physical Exam BP 136/91  Pulse 92  Ht 5\' 2"  (1.575 m)  Wt 182 lb (82.555 kg)  BMI 33.28 kg/m2  LMP 01/29/2013 Gen: NAD Abd: Soft, nontender and nondistended Pelvic: Normal appearing external genitalia; normal appearing vaginal mucosa and cervix.  Normal discharge.  Small uterus, no other palpable masses, no uterine or adnexal tenderness.  No lesions seen.  Assessment & Plan:  Patient told to return if the Bartholin cysts recur or for any GYN concerns.

## 2013-12-01 ENCOUNTER — Emergency Department (HOSPITAL_COMMUNITY)
Admission: EM | Admit: 2013-12-01 | Discharge: 2013-12-01 | Disposition: A | Discharge status: Home / Self Care | Payer: No Typology Code available for payment source | Attending: Emergency Medicine | Admitting: Emergency Medicine

## 2013-12-01 ENCOUNTER — Encounter (HOSPITAL_COMMUNITY): Payer: Self-pay | Admitting: Emergency Medicine

## 2013-12-01 DIAGNOSIS — R21 Rash and other nonspecific skin eruption: Secondary | ICD-10-CM | POA: Insufficient documentation

## 2013-12-01 NOTE — ED Provider Notes (Signed)
Medical screening examination/treatment/procedure(s) were performed by non-physician practitioner and as supervising physician I was immediately available for consultation/collaboration.   EKG Interpretation None      \  Darrnell Mangiaracina N Rebbecca Osuna, DO 12/01/13 2258 

## 2013-12-01 NOTE — Discharge Instructions (Signed)
Take bendaryl (25mg ) with flagyl to help counteract rash and itching.  If this dose makes you drowsy, cut tablet in half. May also take pepcid to help with itching. Return to the ED for new concerns.

## 2013-12-01 NOTE — ED Notes (Signed)
Pt from home reports that she was given Flagyl yesterday for BV and trich. Pt states that she now has rash to her R chest. Pt c/o rash only to her chest. Pt is A&O and in NAD

## 2013-12-01 NOTE — ED Provider Notes (Signed)
CSN: [id number]     Arrival date & time 12/01/13  1814 History  This chart was scribed for [place], DO,  by Stacy Gardner, ED Scribe. The patient was seen in room WTR7/WTR7 and the patient's care was started at 6:50 PM.    First MD Initiated Contact with Patient 12/01/13 1820     Chief Complaint  Patient presents with  . Rash  . Allergic Reaction     (Consider location/radiation/quality/duration/timing/severity/associated sxs/prior Treatment) Patient is a 28 y.o. female presenting with rash and allergic reaction. The history is provided by the patient and medical records. No language interpreter was used.  Rash Allergic Reaction Presenting symptoms: rash (R chest)    HPI Comments: Julia Owens is a 28 y.o. female who presents to the Emergency Department complaining of a itching rash to her right breast, onset last night.  Pt states she was recently started on flagyl for BV and trichomonas-- has taken 3 doses thus far.  States she has taken flagyl before with similar rxn.  Denies throat swelling, SOB, or difficulty swallowing.  No fevers or chills.  Has not tried taking any benadryl or topical treatments PTA.   Past Medical History  Diagnosis Date  . Abscessed tooth     current   History reviewed. No pertinent past surgical history. No family history on file. History  Substance Use Topics  . Smoking status: Never Smoker   . Smokeless tobacco: Never Used  . Alcohol Use: Yes     Comment: socially drinks   OB History   Grav Para Term Preterm Abortions TAB SAB Ect Mult Living   2 0 0 0 1 0 1 0 0 0      Review of Systems  Skin: Positive for rash (R chest).  All other systems reviewed and are negative.      Allergies  Review of patient's allergies indicates no known allergies.  Home Medications   Current Outpatient Rx  Name  Route  Sig  Dispense  Refill  . Multiple Vitamin (MULTIVITAMIN WITH MINERALS) TABS   Oral   Take 1 tablet by mouth daily.           BP 127/75  Pulse 70  Temp(Src) 98.6 F (37 C) (Oral)  Resp 16  SpO2 100%  LMP 11/05/2013  Physical Exam  Nursing note and vitals reviewed. Constitutional: She is oriented to person, place, and time. She appears well-developed and well-nourished. No distress.  HENT:  Head: Normocephalic and atraumatic.  Mouth/Throat: Uvula is midline, oropharynx is clear and moist and mucous membranes are normal. No oropharyngeal exudate, posterior oropharyngeal edema, posterior oropharyngeal erythema or tonsillar abscesses.  No angioedema, airway patent  Eyes: Conjunctivae and EOM are normal. Pupils are equal, round, and reactive to light.  Neck: Normal range of motion. Neck supple.  Cardiovascular: Normal rate, regular rhythm and normal heart sounds.   Pulmonary/Chest: Effort normal and breath sounds normal. No respiratory distress. She has no wheezes.  Musculoskeletal: Normal range of motion.  Neurological: She is alert and oriented to person, place, and time.  Skin: Skin is warm and dry. She is not diaphoretic.  Small pruritic and erythematous patch to right breast; no cellulitic changes or superimposed infection; no drainage; no abscess formation or nipple discharge  Psychiatric: She has a normal mood and affect.    ED Course  Procedures (including critical care time) DIAGNOSTIC STUDIES: Oxygen Saturation is 100% on room air, normal by my interpretation.    COORDINATION OF CARE:  6:54 PM Discussed course of care with pt which includes Benadryl. Pt understands and agrees.   Labs Review Labs Reviewed - No data to display Imaging Review No results found.   EKG Interpretation None      MDM   Final diagnoses:  None   Small erythematous and pruritic patch to right breast without cellulitic change, superimposed infection, or abscess formation.  No angioedema, airway patent.  Advised to take benadryl and pepcid with flagyl to counteract rash/itching.  Discussed plan with pt, they  agreed.  Return precautions advised.  I personally performed the services described in this documentation, which was scribed in my presence. The recorded information has been reviewed and is accurate.  Larene Pickett, PA-C 12/01/13 1948

## 2014-02-05 ENCOUNTER — Inpatient Hospital Stay (HOSPITAL_COMMUNITY)
Admission: AD | Admit: 2014-02-05 | Discharge: 2014-02-05 | Disposition: A | Discharge status: Home / Self Care | Payer: No Typology Code available for payment source | Source: Ambulatory Visit | Attending: Obstetrics & Gynecology | Admitting: Obstetrics & Gynecology

## 2014-02-05 ENCOUNTER — Inpatient Hospital Stay (HOSPITAL_COMMUNITY): Payer: No Typology Code available for payment source

## 2014-02-05 ENCOUNTER — Encounter (HOSPITAL_COMMUNITY): Payer: Self-pay

## 2014-02-05 DIAGNOSIS — O3410 Maternal care for benign tumor of corpus uteri, unspecified trimester: Secondary | ICD-10-CM | POA: Insufficient documentation

## 2014-02-05 DIAGNOSIS — K5900 Constipation, unspecified: Secondary | ICD-10-CM | POA: Insufficient documentation

## 2014-02-05 DIAGNOSIS — O26899 Other specified pregnancy related conditions, unspecified trimester: Secondary | ICD-10-CM

## 2014-02-05 DIAGNOSIS — O99611 Diseases of the digestive system complicating pregnancy, first trimester: Secondary | ICD-10-CM

## 2014-02-05 DIAGNOSIS — O9989 Other specified diseases and conditions complicating pregnancy, childbirth and the puerperium: Principal | ICD-10-CM

## 2014-02-05 DIAGNOSIS — D259 Leiomyoma of uterus, unspecified: Secondary | ICD-10-CM | POA: Insufficient documentation

## 2014-02-05 DIAGNOSIS — R109 Unspecified abdominal pain: Secondary | ICD-10-CM | POA: Insufficient documentation

## 2014-02-05 DIAGNOSIS — O209 Hemorrhage in early pregnancy, unspecified: Secondary | ICD-10-CM

## 2014-02-05 DIAGNOSIS — O99891 Other specified diseases and conditions complicating pregnancy: Secondary | ICD-10-CM | POA: Insufficient documentation

## 2014-02-05 HISTORY — DX: Trichomonal vulvovaginitis: A59.01

## 2014-02-05 HISTORY — DX: Unspecified abnormal cytological findings in specimens from vagina: R87.629

## 2014-02-05 LAB — CBC
HEMATOCRIT: 36.7 % (ref 36.0–46.0)
Hemoglobin: 13.3 g/dL (ref 12.0–15.0)
MCH: 28.1 pg (ref 26.0–34.0)
MCHC: 36.2 g/dL — ABNORMAL HIGH (ref 30.0–36.0)
MCV: 77.6 fL — AB (ref 78.0–100.0)
PLATELETS: 282 10*3/uL (ref 150–400)
RBC: 4.73 MIL/uL (ref 3.87–5.11)
RDW: 13.7 % (ref 11.5–15.5)
WBC: 16.5 10*3/uL — AB (ref 4.0–10.5)

## 2014-02-05 LAB — URINALYSIS, ROUTINE W REFLEX MICROSCOPIC
BILIRUBIN URINE: NEGATIVE
GLUCOSE, UA: NEGATIVE mg/dL
HGB URINE DIPSTICK: NEGATIVE
KETONES UR: NEGATIVE mg/dL
Nitrite: NEGATIVE
PROTEIN: NEGATIVE mg/dL
Specific Gravity, Urine: 1.01 (ref 1.005–1.030)
Urobilinogen, UA: 0.2 mg/dL (ref 0.0–1.0)
pH: 7.5 (ref 5.0–8.0)

## 2014-02-05 LAB — HCG, QUANTITATIVE, PREGNANCY: hCG, Beta Chain, Quant, S: 39780 m[IU]/mL — ABNORMAL HIGH (ref <5)

## 2014-02-05 LAB — WET PREP, GENITAL
TRICH WET PREP: NONE SEEN
WBC, Wet Prep HPF POC: NONE SEEN
Yeast Wet Prep HPF POC: NONE SEEN

## 2014-02-05 LAB — URINE MICROSCOPIC-ADD ON

## 2014-02-05 LAB — POCT PREGNANCY, URINE: Preg Test, Ur: POSITIVE — AB

## 2014-02-05 MED ORDER — DOCUSATE SODIUM 100 MG PO CAPS: 100.0000 mg | ORAL_CAPSULE | Freq: Two times a day (BID) | ORAL | Status: DC

## 2014-02-05 MED ORDER — PROMETHAZINE HCL 25 MG PO TABS: 25.0000 mg | ORAL_TABLET | Freq: Four times a day (QID) | ORAL | Status: DC | PRN

## 2014-02-05 NOTE — MAU Note (Signed)
Patient states she has been having lower abdominal cramping and spotting off and on since last week. Nausea, no vomiting but having reflux.

## 2014-02-05 NOTE — MAU Provider Note (Signed)
History     CSN: [id number]  Arrival date and time: 02/05/14 1536   First Provider Initiated Contact with Patient 02/05/14 1701     Chief Complaint  Patient presents with  . Abdominal Cramping  . Vaginal Discharge   HPIRN note: Julia Sons, RN Registered Nurse Signed  MAU Note Service date: 02/05/2014 3:45 PM   Patient states she has been having lower abdominal cramping and spotting off and on since last week. Nausea, no vomiting but having reflux.     pt is [id number] at ?[redacted]w[redacted]d with irregular periods and abd cramping and spotting on and off since last week. Pt is not spotting today.  Pt denies UTI sx but admits to constipation with last BM 2 days ago and hard. Pt denies vaginal discharge, chills, fever.    Past Medical History  Diagnosis Date  . Abscessed tooth     current  . Vaginal Pap smear, abnormal   . Trichomonas vaginitis     Past Surgical History  Procedure Laterality Date  . Colposcopy      No family history on file.  History  Substance Use Topics  . Smoking status: Never Smoker   . Smokeless tobacco: Never Used  . Alcohol Use: Yes     Comment: socially drinks    Allergies: No Known Allergies  Prescriptions prior to admission  Medication Sig Dispense Refill  . Multiple Vitamin (MULTIVITAMIN WITH MINERALS) TABS Take 1 tablet by mouth daily.         Review of Systems  Constitutional: Negative for fever and chills.  Gastrointestinal: Positive for abdominal pain and constipation. Negative for nausea and vomiting.  Genitourinary: Negative for dysuria and urgency.   Physical Exam   Blood pressure 132/74, pulse 108, temperature 98.5 F (36.9 C), temperature source Oral, resp. rate 16, height 5\' 1"  (1.549 m), weight 89.268 kg (196 lb 12.8 oz), last menstrual period 12/31/2013, SpO2 100.00%.  Physical Exam  Vitals reviewed. Constitutional: She is oriented to person, place, and time. She appears well-developed and well-nourished. No distress.  HENT:   Head: Normocephalic.  Eyes: Pupils are equal, round, and reactive to light.  Neck: Normal range of motion.  Cardiovascular: Normal rate.   Respiratory: Effort normal.  GI: Soft.  Genitourinary: Vagina normal and uterus normal. No vaginal discharge found.  Musculoskeletal: Normal range of motion.  Neurological: She is alert and oriented to person, place, and time.  Skin: Skin is warm and dry.    MAU Course  Procedures Results for orders placed during the hospital encounter of 02/05/14 (from the past 24 hour(s))  URINALYSIS, ROUTINE W REFLEX MICROSCOPIC     Status: Abnormal   Collection Time    02/05/14  3:50 PM      Result Value Ref Range   Color, Urine YELLOW  YELLOW   APPearance CLEAR  CLEAR   Specific Gravity, Urine 1.010  1.005 - 1.030   pH 7.5  5.0 - 8.0   Glucose, UA NEGATIVE  NEGATIVE mg/dL   Hgb urine dipstick NEGATIVE  NEGATIVE   Bilirubin Urine NEGATIVE  NEGATIVE   Ketones, ur NEGATIVE  NEGATIVE mg/dL   Protein, ur NEGATIVE  NEGATIVE mg/dL   Urobilinogen, UA 0.2  0.0 - 1.0 mg/dL   Nitrite NEGATIVE  NEGATIVE   Leukocytes, UA SMALL (*) NEGATIVE  URINE MICROSCOPIC-ADD ON     Status: Abnormal   Collection Time    02/05/14  3:50 PM      Result Value Ref Range  Squamous Epithelial / LPF FEW (*) RARE   WBC, UA 0-2  <3 WBC/hpf  POCT PREGNANCY, URINE     Status: Abnormal   Collection Time    02/05/14  4:04 PM      Result Value Ref Range   Preg Test, Ur POSITIVE (*) NEGATIVE  WET PREP, GENITAL     Status: Abnormal   Collection Time    02/05/14  5:25 PM      Result Value Ref Range   Yeast Wet Prep HPF POC NONE SEEN  NONE SEEN   Trich, Wet Prep NONE SEEN  NONE SEEN   Clue Cells Wet Prep HPF POC FEW (*) NONE SEEN   WBC, Wet Prep HPF POC NONE SEEN  NONE SEEN  CBC     Status: Abnormal   Collection Time    02/05/14  5:35 PM      Result Value Ref Range   WBC 16.5 (*) 4.0 - 10.5 K/uL   RBC 4.73  3.87 - 5.11 MIL/uL   Hemoglobin 13.3  12.0 - 15.0 g/dL   HCT 36.7  36.0  - 46.0 %   MCV 77.6 (*) 78.0 - 100.0 fL   MCH 28.1  26.0 - 34.0 pg   MCHC 36.2 (*) 30.0 - 36.0 g/dL   RDW 13.7  11.5 - 15.5 %   Platelets 282  150 - 400 K/uL   US Ob Comp Less 14 Wks  02/05/2014   CLINICAL DATA:  Left lower quadrant pain.  EXAM: OBSTETRIC <14 WK [place] AND TRANSVAGINAL OB US  TECHNIQUE: Both transabdominal and transvaginal ultrasound examinations were performed for complete evaluation of the gestation as well as the maternal uterus, adnexal regions, and pelvic cul-de-sac. Transvaginal technique was performed to assess early pregnancy.  COMPARISON:  None.  FINDINGS: Intrauterine gestational sac: Visualized/normal in shape.  Yolk sac:  Present  Embryo:  Present  Cardiac Activity: Present  Heart Rate:  107 bpm  MSD:    mm    w     d  CRL:   4.2  mm   6 w 1 d                  [place] EDC: 09/30/2013  Maternal uterus/adnexae: Normal appearance of the ovaries. Multiple uterine fibroids are present, with right fundal fibroid measuring 25 mm and left fundal fibroid measuring 27 mm. Lower uterine segment fibroid measures 18 mm.  No free fluid.  Small subchorionic hemorrhage.  IMPRESSION: Single intrauterine pregnancy with small subchorionic hemorrhage. Fibroid uterus.   Electronically Signed   By: Dereck Ligas M.D.   On: 02/05/2014 19:20   US Ob Transvaginal  02/05/2014   CLINICAL DATA:  Left lower quadrant pain.  EXAM: OBSTETRIC <14 WK [place] AND TRANSVAGINAL OB US  TECHNIQUE: Both transabdominal and transvaginal ultrasound examinations were performed for complete evaluation of the gestation as well as the maternal uterus, adnexal regions, and pelvic cul-de-sac. Transvaginal technique was performed to assess early pregnancy.  COMPARISON:  None.  FINDINGS: Intrauterine gestational sac: Visualized/normal in shape.  Yolk sac:  Present  Embryo:  Present  Cardiac Activity: Present  Heart Rate:  107 bpm  MSD:    mm    w     d  CRL:   4.2  mm   6 w 1 d                  [place] EDC: 09/30/2013  Maternal uterus/adnexae:  Normal appearance of the ovaries.  Multiple uterine fibroids are present, with right fundal fibroid measuring 25 mm and left fundal fibroid measuring 27 mm. Lower uterine segment fibroid measures 18 mm.  No free fluid.  Small subchorionic hemorrhage.  IMPRESSION: Single intrauterine pregnancy with small subchorionic hemorrhage. Fibroid uterus.   Electronically Signed   By: Dereck Ligas M.D.   On: 02/05/2014 19:20   Assessment and Plan  abd pain in pregnancy SLIUP [redacted]w[redacted]d Fibroid F/u with OB care Constipation- Colace; diet reviewed Rx for phenergan if needed for N/V OTC prenatal vitamin  [place] Pugh 02/05/2014, 6:35 PM

## 2014-02-06 LAB — GC/CHLAMYDIA PROBE AMP
CT Probe RNA: NEGATIVE
GC PROBE AMP APTIMA: NEGATIVE

## 2014-02-06 NOTE — MAU Provider Note (Signed)
Attestation of Attending Supervision of Advanced Practitioner (CNM/NP): Evaluation and management procedures were performed by the Advanced Practitioner under my supervision and collaboration.  I have reviewed the Advanced Practitioner's note and chart, and I agree with the management and plan.  Liel Rudden Harraway-Smith 3:30 PM

## 2014-02-13 LAB — CYTOLOGY - PAP: PAP SMEAR: NEGATIVE

## 2014-02-21 LAB — OB RESULTS CONSOLE HEPATITIS B SURFACE ANTIGEN: Hepatitis B Surface Ag: NEGATIVE

## 2014-02-21 LAB — OB RESULTS CONSOLE ABO/RH: RH Type: POSITIVE

## 2014-02-21 LAB — OB RESULTS CONSOLE RPR: RPR: NONREACTIVE

## 2014-02-21 LAB — OB RESULTS CONSOLE HIV ANTIBODY (ROUTINE TESTING): HIV: NONREACTIVE

## 2014-02-21 LAB — OB RESULTS CONSOLE GC/CHLAMYDIA
CHLAMYDIA, DNA PROBE: NEGATIVE
Gonorrhea: NEGATIVE

## 2014-02-21 LAB — OB RESULTS CONSOLE ANTIBODY SCREEN: Antibody Screen: NEGATIVE

## 2014-02-21 LAB — OB RESULTS CONSOLE RUBELLA ANTIBODY, IGM: Rubella: IMMUNE

## 2014-05-14 ENCOUNTER — Inpatient Hospital Stay (HOSPITAL_COMMUNITY)
Admission: AD | Admit: 2014-05-14 | Discharge: 2014-05-14 | Disposition: A | Discharge status: Home / Self Care | Payer: Medicaid Other | Source: Ambulatory Visit | Attending: Obstetrics | Admitting: Obstetrics

## 2014-05-14 ENCOUNTER — Encounter (HOSPITAL_COMMUNITY): Payer: Self-pay

## 2014-05-14 DIAGNOSIS — R81 Glycosuria: Secondary | ICD-10-CM | POA: Diagnosis not present

## 2014-05-14 DIAGNOSIS — J069 Acute upper respiratory infection, unspecified: Secondary | ICD-10-CM | POA: Diagnosis not present

## 2014-05-14 DIAGNOSIS — R059 Cough, unspecified: Secondary | ICD-10-CM | POA: Diagnosis present

## 2014-05-14 DIAGNOSIS — O9989 Other specified diseases and conditions complicating pregnancy, childbirth and the puerperium: Principal | ICD-10-CM

## 2014-05-14 DIAGNOSIS — O99891 Other specified diseases and conditions complicating pregnancy: Secondary | ICD-10-CM | POA: Diagnosis not present

## 2014-05-14 DIAGNOSIS — R05 Cough: Secondary | ICD-10-CM | POA: Diagnosis present

## 2014-05-14 LAB — COMPREHENSIVE METABOLIC PANEL
ALT: 18 U/L (ref 0–35)
ANION GAP: 11 (ref 5–15)
AST: 24 U/L (ref 0–37)
Albumin: 3.4 g/dL — ABNORMAL LOW (ref 3.5–5.2)
Alkaline Phosphatase: 55 U/L (ref 39–117)
BUN: 4 mg/dL — AB (ref 6–23)
CALCIUM: 9.5 mg/dL (ref 8.4–10.5)
CO2: 26 mEq/L (ref 19–32)
CREATININE: 0.53 mg/dL (ref 0.50–1.10)
Chloride: 100 mEq/L (ref 96–112)
GFR calc non Af Amer: >90 mL/min (ref >90)
GLUCOSE: 73 mg/dL (ref 70–99)
Potassium: 4.2 mEq/L (ref 3.7–5.3)
SODIUM: 137 meq/L (ref 137–147)
TOTAL PROTEIN: 6.8 g/dL (ref 6.0–8.3)
Total Bilirubin: 0.2 mg/dL — ABNORMAL LOW (ref 0.3–1.2)

## 2014-05-14 LAB — URINALYSIS, ROUTINE W REFLEX MICROSCOPIC
Bilirubin Urine: NEGATIVE
Hgb urine dipstick: NEGATIVE
Ketones, ur: NEGATIVE mg/dL
LEUKOCYTES UA: NEGATIVE
Nitrite: NEGATIVE
PROTEIN: NEGATIVE mg/dL
Specific Gravity, Urine: 1.025 (ref 1.005–1.030)
UROBILINOGEN UA: 0.2 mg/dL (ref 0.0–1.0)
pH: 6 (ref 5.0–8.0)

## 2014-05-14 LAB — URINE MICROSCOPIC-ADD ON

## 2014-05-14 NOTE — MAU Provider Note (Signed)
History     CSN: [id number]  Arrival date and time: 05/14/14 1343   None     Chief Complaint  Patient presents with  . Cough  . Headache  . Sore Throat   HPI Julia Owens 56 [id number] [redacted]w[redacted]d presents to MAU complaining of cough that began 2 days ago but worsened yesterday.  She wants to know what OTC meds she can safely take while pregnant.  She also complains of sweats, chills, runny nose, eyes watering, itchy throat, headache and ear itching.  No bleeding, LOF, discharge, dysuria, abdominal pain.  She notes drinking lots of juice prior to coming in. OB History   Grav Para Term Preterm Abortions TAB SAB Ect Mult Living   3 0 0 0 2 0 1 0 0 0       Past Medical History  Diagnosis Date  . Abscessed tooth     current  . Vaginal Pap smear, abnormal   . Trichomonas vaginitis     Past Surgical History  Procedure Laterality Date  . Colposcopy      History reviewed. No pertinent family history.  History  Substance Use Topics  . Smoking status: Never Smoker   . Smokeless tobacco: Never Used  . Alcohol Use: Yes     Comment: socially drinks    Allergies: No Known Allergies  Prescriptions prior to admission  Medication Sig Dispense Refill  . docusate sodium (COLACE) 100 MG capsule Take 1 capsule (100 mg total) by mouth every 12 (twelve) hours.  60 capsule  0  . Multiple Vitamin (MULTIVITAMIN WITH MINERALS) TABS Take 1 tablet by mouth daily.       . promethazine (PHENERGAN) 25 MG tablet Take 1 tablet (25 mg total) by mouth every 6 (six) hours as needed for nausea.  20 tablet  0  . promethazine (PHENERGAN) 25 MG tablet Take 1 tablet (25 mg total) by mouth every 6 (six) hours as needed for nausea or vomiting.  30 tablet  0    Review of Systems  Constitutional: Positive for chills and diaphoresis. Negative for fever.  HENT: Positive for congestion and sore throat.   Eyes: Negative for blurred vision and double vision.  Respiratory: Positive for cough and shortness of  breath. Negative for wheezing.   Cardiovascular: Negative for chest pain and palpitations.  Gastrointestinal: Positive for heartburn. Negative for nausea, vomiting and abdominal pain.  Genitourinary: Positive for frequency. Negative for dysuria and urgency.  Skin: Negative for itching and rash.  Neurological: Positive for headaches. Negative for dizziness and weakness.   Physical Exam   Blood pressure 139/80, pulse 109, temperature 99.1 F (37.3 C), temperature source Oral, resp. rate 20, height 5' 2.5" (1.588 m), weight 91.627 kg (202 lb), last menstrual period 12/31/2013, SpO2 100.00%.  Physical Exam  Constitutional: She is oriented to person, place, and time. She appears well-developed and well-nourished. No distress.  HENT:  Head: Normocephalic and atraumatic.  Eyes: EOM are normal.  Neck: Normal range of motion.  Cardiovascular: Normal rate, regular rhythm and normal heart sounds.   Respiratory: Effort normal and breath sounds normal. No respiratory distress.  GI: Soft. Bowel sounds are normal. There is no tenderness.  Musculoskeletal: Normal range of motion.  Neurological: She is alert and oriented to person, place, and time.  Skin: Skin is warm and dry.  Psychiatric: She has a normal mood and affect.   Results for orders placed during the hospital encounter of 05/14/14 (from the past 24 hour(s))  URINALYSIS, ROUTINE W  REFLEX MICROSCOPIC     Status: Abnormal   Collection Time    05/14/14  2:03 PM      Result Value Ref Range   Color, Urine YELLOW  YELLOW   APPearance CLEAR  CLEAR   Specific Gravity, Urine 1.025  1.005 - 1.030   pH 6.0  5.0 - 8.0   Glucose, UA >1000 (*) NEGATIVE mg/dL   Hgb urine dipstick NEGATIVE  NEGATIVE   Bilirubin Urine NEGATIVE  NEGATIVE   Ketones, ur NEGATIVE  NEGATIVE mg/dL   Protein, ur NEGATIVE  NEGATIVE mg/dL   Urobilinogen, UA 0.2  0.0 - 1.0 mg/dL   Nitrite NEGATIVE  NEGATIVE   Leukocytes, UA NEGATIVE  NEGATIVE  URINE MICROSCOPIC-ADD ON      Status: Abnormal   Collection Time    05/14/14  2:03 PM      Result Value Ref Range   Squamous Epithelial / LPF FEW (*) RARE   WBC, UA 0-2  <3 WBC/hpf   Bacteria, UA RARE  RARE  COMPREHENSIVE METABOLIC PANEL     Status: Abnormal   Collection Time    05/14/14  5:04 PM      Result Value Ref Range   Sodium 137  137 - 147 mEq/L   Potassium 4.2  3.7 - 5.3 mEq/L   Chloride 100  96 - 112 mEq/L   CO2 26  19 - 32 mEq/L   Glucose, Bld 73  70 - 99 mg/dL   BUN 4 (*) 6 - 23 mg/dL   Creatinine, Ser 0.53  0.50 - 1.10 mg/dL   Calcium 9.5  8.4 - 10.5 mg/dL   Total Protein 6.8  6.0 - 8.3 g/dL   Albumin 3.4 (*) 3.5 - 5.2 g/dL   AST 24  0 - 37 U/L   ALT 18  0 - 35 U/L   Alkaline Phosphatase 55  39 - 117 U/L   Total Bilirubin 0.2 (*) 0.3 - 1.2 mg/dL   GFR calc non Af Amer >90  >90 mL/min   GFR calc Af Amer >90  >90 mL/min   Anion gap 11  5 - 15   MAU Course  Procedures  MDM No fever.    Assessment and Plan  Assessment: Upper Respiratory Infection Glycosuria with normal blood glucose.  Plan: Discharge to home OTC med sheet provided: Advise Robitussin for cough, Benadryl at night to help with sleep.  Tylenol for headache. Return to MAU for emergencies Follow up with Dr. Ruthann Cancer as scheduled Limit concentrated sweets  Paticia Stack 05/14/2014, 4:20 PM

## 2014-05-14 NOTE — Discharge Instructions (Signed)
Cool Mist Vaporizers °Vaporizers may help relieve the symptoms of a cough and cold. They add moisture to the air, which helps mucus to become thinner and less sticky. This makes it easier to breathe and cough up secretions. Cool mist vaporizers do not cause serious burns like hot mist vaporizers, which may also be called steamers or humidifiers. Vaporizers have not been proven to help with colds. You should not use a vaporizer if you are allergic to mold. °HOME CARE INSTRUCTIONS °· Follow the package instructions for the vaporizer. °· Do not use anything other than distilled water in the vaporizer. °· Do not run the vaporizer all of the time. This can cause mold or bacteria to grow in the vaporizer. °· Clean the vaporizer after each time it is used. °· Clean and dry the vaporizer well before storing it. °· Stop using the vaporizer if worsening respiratory symptoms develop. °Document Released: 06/10/2004 Document Revised: 09/18/2013 Document Reviewed: 01/31/2013 °ExitCare® Patient Information ©2015 [employer]. This information is not intended to replace advice given to you by your health care provider. Make sure you discuss any questions you have with your health care provider. ° °Upper Respiratory Infection, Adult °An upper respiratory infection (URI) is also sometimes known as the common cold. The upper respiratory tract includes the nose, sinuses, throat, trachea, and bronchi. Bronchi are the airways leading to the lungs. Most people improve within 1 week, but symptoms can last up to 2 weeks. A residual cough may last even longer.  °CAUSES °Many different viruses can infect the tissues lining the upper respiratory tract. The tissues become irritated and inflamed and often become very moist. Mucus production is also common. A cold is contagious. You can easily spread the virus to others by oral contact. This includes kissing, sharing a glass, coughing, or sneezing. Touching your mouth or nose and then touching a  surface, which is then touched by another person, can also spread the virus. °SYMPTOMS  °Symptoms typically develop 1 to 3 days after you come in contact with a cold virus. Symptoms vary from person to person. They may include: °· Runny nose. °· Sneezing. °· Nasal congestion. °· Sinus irritation. °· Sore throat. °· Loss of voice (laryngitis). °· Cough. °· Fatigue. °· Muscle aches. °· Loss of appetite. °· Headache. °· Low-grade fever. °DIAGNOSIS  °You might diagnose your own cold based on familiar symptoms, since most people get a cold 2 to 3 times a year. Your caregiver can confirm this based on your exam. Most importantly, your caregiver can check that your symptoms are not due to another disease such as strep throat, sinusitis, pneumonia, asthma, or epiglottitis. Blood tests, throat tests, and X-rays are not necessary to diagnose a common cold, but they may sometimes be helpful in excluding other more serious diseases. Your caregiver will decide if any further tests are required. °RISKS AND COMPLICATIONS  °You may be at risk for a more severe case of the common cold if you smoke cigarettes, have chronic heart disease (such as heart failure) or lung disease (such as asthma), or if you have a weakened immune system. The very young and very old are also at risk for more serious infections. Bacterial sinusitis, middle ear infections, and bacterial pneumonia can complicate the common cold. The common cold can worsen asthma and chronic obstructive pulmonary disease (COPD). Sometimes, these complications can require emergency medical care and may be life-threatening. °PREVENTION  °The best way to protect against getting a cold is to practice good hygiene.   Avoid oral or hand contact with people with cold symptoms. Wash your hands often if contact occurs. There is no clear evidence that vitamin C, vitamin E, echinacea, or exercise reduces the chance of developing a cold. However, it is always recommended to get plenty of  rest and practice good nutrition. °TREATMENT  °Treatment is directed at relieving symptoms. There is no cure. Antibiotics are not effective, because the infection is caused by a virus, not by bacteria. Treatment may include: °· Increased fluid intake. Sports drinks offer valuable electrolytes, sugars, and fluids. °· Breathing heated mist or steam (vaporizer or shower). °· Eating chicken soup or other clear broths, and maintaining good nutrition. °· Getting plenty of rest. °· Using gargles or lozenges for comfort. °· Controlling fevers with ibuprofen or acetaminophen as directed by your caregiver. °· Increasing usage of your inhaler if you have asthma. °Zinc gel and zinc lozenges, taken in the first 24 hours of the common cold, can shorten the duration and lessen the severity of symptoms. Pain medicines may help with fever, muscle aches, and throat pain. A variety of non-prescription medicines are available to treat congestion and runny nose. Your caregiver can make recommendations and may suggest nasal or lung inhalers for other symptoms.  °HOME CARE INSTRUCTIONS  °· Only take over-the-counter or prescription medicines for pain, discomfort, or fever as directed by your caregiver. °· Use a warm mist humidifier or inhale steam from a shower to increase air moisture. This may keep secretions moist and make it easier to breathe. °· Drink enough water and fluids to keep your urine clear or pale yellow. °· Rest as needed. °· Return to work when your temperature has returned to normal or as your caregiver advises. You may need to stay home longer to avoid infecting others. You can also use a face mask and careful hand washing to prevent spread of the virus. °SEEK MEDICAL CARE IF:  °· After the first few days, you feel you are getting worse rather than better. °· You need your caregiver's advice about medicines to control symptoms. °· You develop chills, worsening shortness of breath, or brown or red sputum. These may be  signs of pneumonia. °· You develop yellow or brown nasal discharge or pain in the face, especially when you bend forward. These may be signs of sinusitis. °· You develop a fever, swollen neck glands, pain with swallowing, or white areas in the back of your throat. These may be signs of strep throat. °SEEK IMMEDIATE MEDICAL CARE IF:  °· You have a fever. °· You develop severe or persistent headache, ear pain, sinus pain, or chest pain. °· You develop wheezing, a prolonged cough, cough up blood, or have a change in your usual mucus (if you have chronic lung disease). °· You develop sore muscles or a stiff neck. °Document Released: 03/09/2001 Document Revised: 12/06/2011 Document Reviewed: 12/19/2013 °ExitCare® Patient Information ©2015 [employer]. This information is not intended to replace advice given to you by your health care provider. Make sure you discuss any questions you have with your health care provider. ° °

## 2014-05-14 NOTE — MAU Note (Signed)
Patient states she started with a slight cough on Sunday, started getting worse yesterday with a sore throat and headache. Not sure what to take OTC for a cold. States she had some right side pain with the coughing.

## 2014-05-14 NOTE — MAU Note (Signed)
Patient denies bleeding or leaking.

## 2014-06-28 ENCOUNTER — Other Ambulatory Visit (HOSPITAL_COMMUNITY): Payer: Self-pay

## 2014-07-11 ENCOUNTER — Ambulatory Visit (HOSPITAL_COMMUNITY)
Admission: RE | Admit: 2014-07-11 | Discharge: 2014-07-11 | Disposition: A | Discharge status: Home / Self Care | Payer: Medicaid Other | Source: Ambulatory Visit | Attending: Obstetrics | Admitting: Obstetrics

## 2014-07-11 ENCOUNTER — Encounter: Payer: Medicaid Other | Attending: Obstetrics | Admitting: *Deleted

## 2014-07-11 DIAGNOSIS — Z713 Dietary counseling and surveillance: Secondary | ICD-10-CM | POA: Diagnosis not present

## 2014-07-11 DIAGNOSIS — O24414 Gestational diabetes mellitus in pregnancy, insulin controlled: Secondary | ICD-10-CM | POA: Insufficient documentation

## 2014-07-11 DIAGNOSIS — Z794 Long term (current) use of insulin: Secondary | ICD-10-CM | POA: Insufficient documentation

## 2014-07-11 NOTE — Progress Notes (Signed)
  Patient was seen on 07/11/14 for Gestational Diabetes self-management . The following learning objectives were met by the patient :   States the definition of Gestational Diabetes  States why dietary management is important in controlling blood glucose  Describes the effects of carbohydrates on blood glucose levels  Demonstrates ability to create a balanced meal plan  Demonstrates carbohydrate counting   States when to check blood glucose levels  Demonstrates proper blood glucose monitoring techniques  States the effect of stress and exercise on blood glucose levels  States the importance of limiting caffeine and abstaining from alcohol and smoking  Plan:  Aim for 2 Carb Choices per meal (30 grams) +/- 1 either way for breakfast Aim for 3 Carb Choices per meal (45 grams) +/- 1 either way from lunch and dinner Aim for 1-2 Carbs per snack Begin reading food labels for Total Carbohydrate and sugar grams of foods Consider  increasing your activity level by walking daily as tolerated Begin checking BG before breakfast and 2 hours after first bit of breakfast, lunch and dinner after  as directed by MD  Take medication  as directed by MD  Order for Accu-Chek [place] to [place] Blood glucose reading: 3hpp $RemoveBefor'87mg'[id number]$ /dl  Patient instructed to monitor glucose levels: FBS: 60 - <90 2 hour: <120  Patient received the following handouts:  Nutrition Diabetes and Pregnancy  Carbohydrate Counting List  Meal Planning worksheet  Patient will be seen for follow-up as needed.

## 2014-07-15 ENCOUNTER — Telehealth: Payer: Self-pay | Admitting: *Deleted

## 2014-07-16 ENCOUNTER — Encounter (HOSPITAL_COMMUNITY): Payer: Self-pay

## 2014-07-16 ENCOUNTER — Ambulatory Visit (HOSPITAL_COMMUNITY)
Admission: RE | Admit: 2014-07-16 | Discharge: 2014-07-16 | Disposition: A | Discharge status: Home / Self Care | Payer: Medicaid Other | Source: Ambulatory Visit | Attending: Obstetrics | Admitting: Obstetrics

## 2014-07-16 VITALS — BP 137/78 | HR 95 | Wt 208.2 lb

## 2014-07-16 DIAGNOSIS — O24410 Gestational diabetes mellitus in pregnancy, diet controlled: Secondary | ICD-10-CM | POA: Diagnosis not present

## 2014-07-16 DIAGNOSIS — Z3A29 29 weeks gestation of pregnancy: Secondary | ICD-10-CM | POA: Insufficient documentation

## 2014-07-16 DIAGNOSIS — O24419 Gestational diabetes mellitus in pregnancy, unspecified control: Secondary | ICD-10-CM

## 2014-07-16 NOTE — Consult Note (Signed)
Maternal Fetal Medicine Consultation  Requesting Provider(s): Gracy Racer, MD  Reason for consultation: Recently diagnosed Gestational diabetes  HPI: Julia Owens is a 28 yo G3P0020 currently at 35w1dwho was seen for consultation due to recently diagnosed gestational diabetes.  Her 1-hr OGTT was 171 mg/dl; 3-hr test 100/223/186/209 mg/dl.  Ms. LMondesirwas recently seen for diabetes counseling - has had some difficulty getting a glucometer due to insurance issues, but will pick one up tonight and begin 4x daily fingersticks.  She is currently on a diabetic diet.  She reports that she has been able to check some fingerstick values at work that have all been in the 80-124 range. The patient's prenatal course has otherwise been uncomplicated.  The fetus is active.  She denies vaginal bleeding, leakage of fluid or uterine contractions.  OB History: OB History   Grav Para Term Preterm Abortions TAB SAB Ect Mult Living   _0      PMH:  Past Medical History  Diagnosis Date  . Abscessed tooth     current  . Vaginal Pap smear, abnormal   . Trichomonas vaginitis     PSH:  Past Surgical History  Procedure Laterality Date  . Colposcopy     Meds:  Current Outpatient Prescriptions on File Prior to Encounter  Medication Sig Dispense Refill  . ACCU-CHEK FASTCLIX LANCETS MISC 100 each by Does not apply route 4 (four) times daily. Disp #100 with #10 refills for testing 4X daily DX gestational diabetes      . Blood Glucose Monitoring Suppl (ACCU-CHEK AVIVA PLUS) W/DEVICE KIT 1 each by Does not apply route. New diagnosis Gestational diabetes for testing 4 times daily      . glucose blood (ACCU-CHEK AVIVA PLUS) test strip 100 each by Other route 4 (four) times daily. New diagnosis GDM for testing 4Xdaily disp #100 #10 refills      . Lancets Misc. (ACCU-CHEK FASTCLIX LANCET) KIT 1 each by Does not apply route. For new DX Gestational Diabetes      . Prenatal Vit-Fe Fumarate-FA  (PRENATAL MULTIVITAMIN) TABS tablet Take 1 tablet by mouth daily at 12 noon.       No current facility-administered medications on file prior to encounter.   Allergies: No Known Allergies  FH: Father - type 2 diabetes.  Mother - CHTN.  Denies family history of birth defects or hereditary disorders  Soc:  History   Social History  . Marital Status: Single    Spouse Name: N/A    Number of Children: N/A  . Years of Education: N/A   Occupational History  . Not on file.   Social History Main Topics  . Smoking status: Never Smoker   . Smokeless tobacco: Never Used  . Alcohol Use: Yes     Comment: socially drinks  . Drug Use: No  . Sexual Activity: Yes    Birth Control/ Protection: None   Other Topics Concern  . Not on file   Social History Narrative  . No narrative on file    Review of Systems: no vaginal bleeding or cramping/contractions, no LOF, no nausea/vomiting. All other systems reviewed and are negative.  PE:   Filed Vitals:   07/16/14 1117  BP: 137/78  Pulse: 95   A/P: 1) Single IUP at 229w1d       2) Recently diagnosed gestational diabetes - reviewed risks associated with gestational diabetes to include fetal macrosomia, neonatal metabolic problems (  hypoglycemia, hyperbilirubinemia), increased risk of cesarean section and birth injury.  We also discussed increased risk of stillbirth / polyhydramnios if blood sugars are poorly controlled.  She will continue her diabetic diet and will begin fingerstick glucose checks tonight - will check fasting and 2-hr post prandial values.  Will determine management options once fingerstick values have been reviewed.  Recommendations: 1) Ultrasound for fetal growth/ anatomy next week.  Will review fingerstick values at that time.  If needed will begin oral agents vs. Insulin at that time. 2) The patient will need to call in fingerstick values every 1-2 weeks thereafter.  If diet controlled only, there is no need for antenatal  testing, but would recommend an ultrasound for growth at approximately 37-[redacted] weeks gestation. 3) If oral agents or insulin is required, would recommend serial growth ultrasounds and antenatal testing beginning at 32 weeks.  In that situation, would also recommend delivery at 39 weeks.  Thank you for the opportunity to be a part of the care of [place]. Please contact our office if we can be of further assistance.   I spent approximately 30 minutes with this patient with over 50% of time spent in face-to-face counseling.  Benjaman Lobe, MD Maternal Fetal Medicine

## 2014-07-25 ENCOUNTER — Other Ambulatory Visit (HOSPITAL_COMMUNITY): Payer: Self-pay | Admitting: Maternal and Fetal Medicine

## 2014-07-25 ENCOUNTER — Encounter (HOSPITAL_COMMUNITY): Payer: Self-pay

## 2014-07-25 ENCOUNTER — Ambulatory Visit (HOSPITAL_COMMUNITY)
Admission: RE | Admit: 2014-07-25 | Discharge: 2014-07-25 | Disposition: A | Discharge status: Home / Self Care | Payer: Medicaid Other | Source: Ambulatory Visit | Attending: Obstetrics | Admitting: Obstetrics

## 2014-07-25 VITALS — BP 128/74 | HR 102 | Wt 207.0 lb

## 2014-07-25 DIAGNOSIS — Z36 Encounter for antenatal screening of mother: Secondary | ICD-10-CM | POA: Insufficient documentation

## 2014-07-25 DIAGNOSIS — O24410 Gestational diabetes mellitus in pregnancy, diet controlled: Secondary | ICD-10-CM | POA: Diagnosis not present

## 2014-07-25 DIAGNOSIS — Z1389 Encounter for screening for other disorder: Secondary | ICD-10-CM

## 2014-07-25 DIAGNOSIS — O24419 Gestational diabetes mellitus in pregnancy, unspecified control: Secondary | ICD-10-CM

## 2014-07-25 DIAGNOSIS — Z3A30 30 weeks gestation of pregnancy: Secondary | ICD-10-CM | POA: Insufficient documentation

## 2014-07-29 ENCOUNTER — Encounter (HOSPITAL_COMMUNITY): Payer: Self-pay

## 2014-09-05 ENCOUNTER — Encounter (HOSPITAL_COMMUNITY): Payer: Self-pay

## 2014-09-05 ENCOUNTER — Other Ambulatory Visit (HOSPITAL_COMMUNITY): Payer: Self-pay

## 2014-09-05 ENCOUNTER — Other Ambulatory Visit (HOSPITAL_COMMUNITY): Payer: Self-pay | Admitting: Obstetrics

## 2014-09-05 ENCOUNTER — Other Ambulatory Visit (HOSPITAL_COMMUNITY): Payer: Self-pay | Admitting: Maternal and Fetal Medicine

## 2014-09-05 ENCOUNTER — Ambulatory Visit (HOSPITAL_COMMUNITY)
Admission: RE | Admit: 2014-09-05 | Discharge: 2014-09-05 | Disposition: A | Discharge status: Home / Self Care | Payer: Medicaid Other | Source: Ambulatory Visit | Attending: Obstetrics | Admitting: Obstetrics

## 2014-09-05 DIAGNOSIS — O24419 Gestational diabetes mellitus in pregnancy, unspecified control: Secondary | ICD-10-CM | POA: Diagnosis not present

## 2014-09-05 DIAGNOSIS — O24410 Gestational diabetes mellitus in pregnancy, diet controlled: Secondary | ICD-10-CM

## 2014-09-05 DIAGNOSIS — Z3A36 36 weeks gestation of pregnancy: Secondary | ICD-10-CM | POA: Insufficient documentation

## 2014-09-05 LAB — OB RESULTS CONSOLE GBS: GBS: POSITIVE

## 2014-09-05 NOTE — ED Notes (Signed)
Prescription for Glyburide 2.5mg  BID called into [facility] [address].  Instructed patient on s&s of low blood sugars.  Pt verbalized understanding.

## 2014-09-10 ENCOUNTER — Other Ambulatory Visit: Payer: Self-pay | Admitting: Obstetrics

## 2014-09-10 ENCOUNTER — Other Ambulatory Visit (HOSPITAL_COMMUNITY): Payer: Self-pay | Admitting: Obstetrics

## 2014-09-10 ENCOUNTER — Ambulatory Visit (HOSPITAL_COMMUNITY)
Admission: RE | Admit: 2014-09-10 | Discharge: 2014-09-10 | Disposition: A | Discharge status: Home / Self Care | Payer: Medicaid Other | Source: Ambulatory Visit | Attending: Obstetrics | Admitting: Obstetrics

## 2014-09-10 ENCOUNTER — Encounter (HOSPITAL_COMMUNITY): Payer: Self-pay

## 2014-09-10 DIAGNOSIS — O24419 Gestational diabetes mellitus in pregnancy, unspecified control: Secondary | ICD-10-CM | POA: Insufficient documentation

## 2014-09-10 DIAGNOSIS — O24414 Gestational diabetes mellitus in pregnancy, insulin controlled: Secondary | ICD-10-CM | POA: Insufficient documentation

## 2014-09-10 DIAGNOSIS — Z794 Long term (current) use of insulin: Secondary | ICD-10-CM | POA: Insufficient documentation

## 2014-09-10 DIAGNOSIS — Z3A37 37 weeks gestation of pregnancy: Secondary | ICD-10-CM | POA: Insufficient documentation

## 2014-09-10 DIAGNOSIS — O24410 Gestational diabetes mellitus in pregnancy, diet controlled: Secondary | ICD-10-CM

## 2014-09-10 DIAGNOSIS — O3410 Maternal care for benign tumor of corpus uteri, unspecified trimester: Secondary | ICD-10-CM | POA: Insufficient documentation

## 2014-09-10 HISTORY — DX: Gestational diabetes mellitus in pregnancy, unspecified control: O24.419

## 2014-09-13 ENCOUNTER — Ambulatory Visit (HOSPITAL_COMMUNITY)
Admission: RE | Admit: 2014-09-13 | Discharge: 2014-09-13 | Disposition: A | Discharge status: Home / Self Care | Payer: Medicaid Other | Source: Ambulatory Visit | Attending: Obstetrics | Admitting: Obstetrics

## 2014-09-13 DIAGNOSIS — O24410 Gestational diabetes mellitus in pregnancy, diet controlled: Secondary | ICD-10-CM | POA: Insufficient documentation

## 2014-09-16 ENCOUNTER — Ambulatory Visit (HOSPITAL_COMMUNITY)
Admission: RE | Admit: 2014-09-16 | Discharge: 2014-09-16 | Disposition: A | Discharge status: Home / Self Care | Payer: Medicaid Other | Source: Ambulatory Visit | Attending: Obstetrics | Admitting: Obstetrics

## 2014-09-16 DIAGNOSIS — O24410 Gestational diabetes mellitus in pregnancy, diet controlled: Secondary | ICD-10-CM | POA: Insufficient documentation

## 2014-09-16 DIAGNOSIS — D259 Leiomyoma of uterus, unspecified: Secondary | ICD-10-CM | POA: Diagnosis not present

## 2014-09-16 DIAGNOSIS — O3413 Maternal care for benign tumor of corpus uteri, third trimester: Secondary | ICD-10-CM | POA: Insufficient documentation

## 2014-09-16 DIAGNOSIS — Z3A38 38 weeks gestation of pregnancy: Secondary | ICD-10-CM | POA: Insufficient documentation

## 2014-09-23 ENCOUNTER — Telehealth (HOSPITAL_COMMUNITY): Payer: Self-pay | Admitting: *Deleted

## 2014-09-23 ENCOUNTER — Ambulatory Visit (HOSPITAL_COMMUNITY)
Admission: RE | Admit: 2014-09-23 | Discharge: 2014-09-23 | Disposition: A | Discharge status: Home / Self Care | Payer: Medicaid Other | Source: Ambulatory Visit | Attending: Obstetrics | Admitting: Obstetrics

## 2014-09-23 ENCOUNTER — Other Ambulatory Visit (HOSPITAL_COMMUNITY): Payer: Self-pay | Admitting: Obstetrics

## 2014-09-23 ENCOUNTER — Encounter (HOSPITAL_COMMUNITY): Payer: Self-pay | Admitting: *Deleted

## 2014-09-23 ENCOUNTER — Encounter (HOSPITAL_COMMUNITY): Payer: Self-pay

## 2014-09-23 DIAGNOSIS — Z3A39 39 weeks gestation of pregnancy: Secondary | ICD-10-CM | POA: Insufficient documentation

## 2014-09-23 DIAGNOSIS — O24419 Gestational diabetes mellitus in pregnancy, unspecified control: Secondary | ICD-10-CM | POA: Insufficient documentation

## 2014-09-23 DIAGNOSIS — O24410 Gestational diabetes mellitus in pregnancy, diet controlled: Secondary | ICD-10-CM

## 2014-09-23 DIAGNOSIS — O3410 Maternal care for benign tumor of corpus uteri, unspecified trimester: Secondary | ICD-10-CM | POA: Insufficient documentation

## 2014-09-23 NOTE — Telephone Encounter (Signed)
Preadmission screen  

## 2014-09-24 ENCOUNTER — Inpatient Hospital Stay (HOSPITAL_COMMUNITY): Payer: Medicaid Other | Admitting: Anesthesiology

## 2014-09-24 ENCOUNTER — Encounter (HOSPITAL_COMMUNITY): Payer: Self-pay

## 2014-09-24 ENCOUNTER — Inpatient Hospital Stay (HOSPITAL_COMMUNITY)
Admission: RE | Admit: 2014-09-24 | Discharge: 2014-09-28 | DRG: 765 | Disposition: A | Discharge status: Home / Self Care | Payer: Medicaid Other | Source: Ambulatory Visit | Attending: Obstetrics | Admitting: Obstetrics

## 2014-09-24 DIAGNOSIS — Z3A39 39 weeks gestation of pregnancy: Secondary | ICD-10-CM | POA: Diagnosis present

## 2014-09-24 DIAGNOSIS — O2432 Unspecified pre-existing diabetes mellitus in childbirth: Secondary | ICD-10-CM | POA: Diagnosis present

## 2014-09-24 DIAGNOSIS — O99824 Streptococcus B carrier state complicating childbirth: Secondary | ICD-10-CM | POA: Diagnosis present

## 2014-09-24 DIAGNOSIS — E119 Type 2 diabetes mellitus without complications: Secondary | ICD-10-CM | POA: Diagnosis present

## 2014-09-24 DIAGNOSIS — Z98891 History of uterine scar from previous surgery: Secondary | ICD-10-CM

## 2014-09-24 DIAGNOSIS — Z3490 Encounter for supervision of normal pregnancy, unspecified, unspecified trimester: Secondary | ICD-10-CM

## 2014-09-24 LAB — CBC
HCT: 37.5 % (ref 36.0–46.0)
Hemoglobin: 13.3 g/dL (ref 12.0–15.0)
MCH: 28.3 pg (ref 26.0–34.0)
MCHC: 35.5 g/dL (ref 30.0–36.0)
MCV: 79.8 fL (ref 78.0–100.0)
PLATELETS: 196 10*3/uL (ref 150–400)
RBC: 4.7 MIL/uL (ref 3.87–5.11)
RDW: 14.3 % (ref 11.5–15.5)
WBC: 11.6 10*3/uL — AB (ref 4.0–10.5)

## 2014-09-24 LAB — TYPE AND SCREEN
ABO/RH(D): A POS
ANTIBODY SCREEN: NEGATIVE

## 2014-09-24 LAB — GLUCOSE, CAPILLARY: Glucose-Capillary: 133 mg/dL — ABNORMAL HIGH (ref 70–99)

## 2014-09-24 LAB — RPR

## 2014-09-24 MED ORDER — OXYTOCIN BOLUS FROM INFUSION: 500.0000 mL | INTRAVENOUS | Status: DC

## 2014-09-24 MED ORDER — CITRIC ACID-SODIUM CITRATE 334-500 MG/5ML PO SOLN
30.0000 mL | ORAL | Status: DC | PRN
Start: 2014-09-24 — End: 2014-09-25
  Administered 2014-09-24 – 2014-09-25 (×2): 30 mL via ORAL
  Filled 2014-09-24 (×2): qty 15

## 2014-09-24 MED ORDER — LACTATED RINGERS IV SOLN: 500.0000 mL | Freq: Once | INTRAVENOUS | Status: DC

## 2014-09-24 MED ORDER — BUTORPHANOL TARTRATE 1 MG/ML IJ SOLN
1.0000 mg | INTRAMUSCULAR | Status: DC | PRN
  Administered 2014-09-24 (×3): 1 mg via INTRAVENOUS
  Filled 2014-09-24 (×3): qty 1

## 2014-09-24 MED ORDER — TERBUTALINE SULFATE 1 MG/ML IJ SOLN: 0.2500 mg | Freq: Once | INTRAMUSCULAR | Status: AC | PRN

## 2014-09-24 MED ORDER — LIDOCAINE HCL (PF) 1 % IJ SOLN
INTRAMUSCULAR | Status: DC | PRN
  Administered 2014-09-24: 6 mL
  Administered 2014-09-24: 4 mL

## 2014-09-24 MED ORDER — DIPHENHYDRAMINE HCL 50 MG/ML IJ SOLN: 12.5000 mg | INTRAMUSCULAR | Status: DC | PRN

## 2014-09-24 MED ORDER — ONDANSETRON HCL 4 MG/2ML IJ SOLN: 4.0000 mg | Freq: Four times a day (QID) | INTRAMUSCULAR | Status: DC | PRN

## 2014-09-24 MED ORDER — PENICILLIN G POTASSIUM 5000000 UNITS IJ SOLR
2.5000 10*6.[IU] | INTRAVENOUS | Status: DC
  Administered 2014-09-24 – 2014-09-25 (×6): 2.5 10*6.[IU] via INTRAVENOUS
  Filled 2014-09-24 (×11): qty 2.5

## 2014-09-24 MED ORDER — ACETAMINOPHEN 325 MG PO TABS: 650.0000 mg | ORAL_TABLET | ORAL | Status: DC | PRN

## 2014-09-24 MED ORDER — PHENYLEPHRINE 40 MCG/ML (10ML) SYRINGE FOR IV PUSH (FOR BLOOD PRESSURE SUPPORT)
80.0000 ug | PREFILLED_SYRINGE | INTRAVENOUS | Status: DC | PRN
  Filled 2014-09-24: qty 10

## 2014-09-24 MED ORDER — FLEET ENEMA 7-19 GM/118ML RE ENEM: 1.0000 | ENEMA | RECTAL | Status: DC | PRN

## 2014-09-24 MED ORDER — LIDOCAINE HCL (PF) 1 % IJ SOLN: 30.0000 mL | INTRAMUSCULAR | Status: DC | PRN

## 2014-09-24 MED ORDER — OXYTOCIN 40 UNITS IN LACTATED RINGERS INFUSION - SIMPLE MED
62.5000 mL/h | INTRAVENOUS | Status: DC
  Filled 2014-09-24 (×2): qty 1000

## 2014-09-24 MED ORDER — PENICILLIN G POTASSIUM 5000000 UNITS IJ SOLR
5.0000 10*6.[IU] | Freq: Once | INTRAVENOUS | Status: AC
  Administered 2014-09-24: 5 10*6.[IU] via INTRAVENOUS
  Filled 2014-09-24: qty 5

## 2014-09-24 MED ORDER — FENTANYL 2.5 MCG/ML BUPIVACAINE 1/10 % EPIDURAL INFUSION (WH - ANES): 14.0000 mL/h | INTRAMUSCULAR | Status: DC | PRN

## 2014-09-24 MED ORDER — OXYCODONE-ACETAMINOPHEN 5-325 MG PO TABS: 2.0000 | ORAL_TABLET | ORAL | Status: DC | PRN

## 2014-09-24 MED ORDER — EPHEDRINE 5 MG/ML INJ: 10.0000 mg | INTRAVENOUS | Status: DC | PRN

## 2014-09-24 MED ORDER — LACTATED RINGERS IV SOLN: 500.0000 mL | INTRAVENOUS | Status: DC | PRN

## 2014-09-24 MED ORDER — LACTATED RINGERS IV SOLN
INTRAVENOUS | Status: DC
  Administered 2014-09-24 – 2014-09-25 (×2): via INTRAVENOUS

## 2014-09-24 MED ORDER — OXYCODONE-ACETAMINOPHEN 5-325 MG PO TABS
1.0000 | ORAL_TABLET | ORAL | Status: DC | PRN
Start: 2014-09-24 — End: 2014-09-28
  Administered 2014-09-27: 1 via ORAL

## 2014-09-24 MED ORDER — PHENYLEPHRINE 40 MCG/ML (10ML) SYRINGE FOR IV PUSH (FOR BLOOD PRESSURE SUPPORT): 80.0000 ug | PREFILLED_SYRINGE | INTRAVENOUS | Status: DC | PRN

## 2014-09-24 MED ORDER — FENTANYL 2.5 MCG/ML BUPIVACAINE 1/10 % EPIDURAL INFUSION (WH - ANES)
14.0000 mL/h | INTRAMUSCULAR | Status: DC | PRN
  Administered 2014-09-24 – 2014-09-25 (×2): 14 mL/h via EPIDURAL
  Filled 2014-09-24 (×2): qty 125

## 2014-09-24 MED ORDER — OXYTOCIN 40 UNITS IN LACTATED RINGERS INFUSION - SIMPLE MED
1.0000 m[IU]/min | INTRAVENOUS | Status: DC
  Administered 2014-09-24: 6 m[IU]/min via INTRAVENOUS
  Administered 2014-09-24: 10 m[IU]/min via INTRAVENOUS
  Administered 2014-09-24: 2 m[IU]/min via INTRAVENOUS
  Administered 2014-09-24: 4 m[IU]/min via INTRAVENOUS
  Administered 2014-09-24: 12 m[IU]/min via INTRAVENOUS
  Administered 2014-09-24: 8 m[IU]/min via INTRAVENOUS
  Administered 2014-09-25: 32 m[IU]/min via INTRAVENOUS
  Administered 2014-09-25: 26 m[IU]/min via INTRAVENOUS

## 2014-09-24 NOTE — Anesthesia Preprocedure Evaluation (Addendum)
Anesthesia Evaluation  Patient identified by MRN, date of birth, ID band Patient awake    Reviewed: Allergy & Precautions, H&P , NPO status , Patient's Chart, lab work & pertinent test results  Airway Mallampati: II  TM Distance: >3 FB Neck ROM: full    Dental no notable dental hx. (+) Teeth Intact   Pulmonary neg pulmonary ROS,  breath sounds clear to auscultation  Pulmonary exam normal       Cardiovascular negative cardio ROS  Rhythm:regular Rate:Normal     Neuro/Psych negative neurological ROS  negative psych ROS   GI/Hepatic negative GI ROS, Neg liver ROS,   Endo/Other  diabetes, Well Controlled, GestationalMorbid obesity  Renal/GU negative Renal ROS  negative genitourinary   Musculoskeletal negative musculoskeletal ROS (+)   Abdominal Normal abdominal exam  (+) + obese,   Peds  Hematology negative hematology ROS (+)   Anesthesia Other Findings       Reproductive/Obstetrics (+) Pregnancy                            Anesthesia Physical Anesthesia Plan  ASA: III and emergent  Anesthesia Plan: Epidural   Post-op Pain Management:    Induction:   Airway Management Planned: Natural Airway  Additional Equipment:   Intra-op Plan:   Post-operative Plan:   Informed Consent: I have reviewed the patients History and Physical, chart, labs and discussed the procedure including the risks, benefits and alternatives for the proposed anesthesia with the patient or authorized representative who has indicated his/her understanding and acceptance.   Dental Advisory Given  Plan Discussed with: Anesthesiologist, CRNA and Surgeon  Anesthesia Plan Comments: (Labs checked- platelets confirmed with RN in room. Fetal heart tracing, per RN, reported to be stable enough for sitting procedure. Discussed epidural, and patient consents to the procedure:  included risk of possible headache,backache,  failed block, allergic reaction, and nerve injury. This patient was asked if she had any questions or concerns before the procedure started.  For C/Section for failure to progress. Will use epidural catheter for C/Section. M. Anagha Loseke,MD)       Anesthesia Quick Evaluation

## 2014-09-24 NOTE — Anesthesia Procedure Notes (Signed)

## 2014-09-24 NOTE — H&P (Signed)
This is Dr. Gracy Racer dictating the history and physical on  Julia Owens she's a 28 year old gravida 3 para 0020 at 29 weeks and a day [facility] 09/30/2014 positive GBS she got penicillin patient's a diabetic on glyburide 2.5 by mouth twice a day blood sugar an admission 1:30 and she is in for induction is no 2 cm 70% vertex -1 to -2 amniotomy performed the fluids clear estimated fetal weight 7 pound she is on low-dose Pitocin contracting irregularly Past medical history negative Past surgical history negative Social history negative System review negative Physical exam well-developed female in no distress HEENT negative Lungs clear to P&A Heart regular rhythm no murmurs no gallops Breasts negative Abdomen term Pelvic as described above Extremities negative

## 2014-09-25 ENCOUNTER — Encounter (HOSPITAL_COMMUNITY): Payer: Self-pay

## 2014-09-25 ENCOUNTER — Encounter (HOSPITAL_COMMUNITY)
Admission: RE | Disposition: A | Discharge status: Home / Self Care | Payer: Self-pay | Source: Ambulatory Visit | Attending: Obstetrics

## 2014-09-25 DIAGNOSIS — Z98891 History of uterine scar from previous surgery: Secondary | ICD-10-CM

## 2014-09-25 LAB — GLUCOSE, CAPILLARY
GLUCOSE-CAPILLARY: 124 mg/dL — AB (ref 70–99)
Glucose-Capillary: 122 mg/dL — ABNORMAL HIGH (ref 70–99)

## 2014-09-25 SURGERY — Surgical Case
Anesthesia: Epidural | Site: Abdomen

## 2014-09-25 MED ORDER — LIDOCAINE-EPINEPHRINE (PF) 2 %-1:200000 IJ SOLN
INTRAMUSCULAR | Status: AC
  Filled 2014-09-25: qty 20

## 2014-09-25 MED ORDER — DIBUCAINE 1 % RE OINT: 1.0000 "application " | TOPICAL_OINTMENT | RECTAL | Status: DC | PRN

## 2014-09-25 MED ORDER — ONDANSETRON HCL 4 MG/2ML IJ SOLN
4.0000 mg | INTRAMUSCULAR | Status: DC | PRN
  Administered 2014-09-25 (×2): 4 mg via INTRAVENOUS
  Filled 2014-09-25 (×2): qty 2

## 2014-09-25 MED ORDER — DIPHENHYDRAMINE HCL 25 MG PO CAPS: 25.0000 mg | ORAL_CAPSULE | ORAL | Status: DC | PRN

## 2014-09-25 MED ORDER — NALBUPHINE HCL 10 MG/ML IJ SOLN
5.0000 mg | INTRAMUSCULAR | Status: DC | PRN
  Administered 2014-09-26: 5 mg via INTRAVENOUS
  Filled 2014-09-25: qty 1

## 2014-09-25 MED ORDER — PHENYLEPHRINE HCL 10 MG/ML IJ SOLN
INTRAMUSCULAR | Status: DC | PRN
  Administered 2014-09-25: 80 ug via INTRAVENOUS

## 2014-09-25 MED ORDER — LACTATED RINGERS IV SOLN
INTRAVENOUS | Status: DC
  Administered 2014-09-25: 21:00:00 via INTRAVENOUS

## 2014-09-25 MED ORDER — MORPHINE SULFATE (PF) 0.5 MG/ML IJ SOLN
INTRAMUSCULAR | Status: DC | PRN
  Administered 2014-09-25: 4 mg via EPIDURAL

## 2014-09-25 MED ORDER — SODIUM BICARBONATE 8.4 % IV SOLN
INTRAVENOUS | Status: AC
  Filled 2014-09-25: qty 50

## 2014-09-25 MED ORDER — SCOPOLAMINE 1 MG/3DAYS TD PT72
MEDICATED_PATCH | TRANSDERMAL | Status: AC
Start: 2014-09-25 — End: 2014-09-26
  Filled 2014-09-25: qty 1

## 2014-09-25 MED ORDER — LANOLIN HYDROUS EX OINT: 1.0000 "application " | TOPICAL_OINTMENT | CUTANEOUS | Status: DC | PRN

## 2014-09-25 MED ORDER — NALBUPHINE HCL 10 MG/ML IJ SOLN
5.0000 mg | INTRAMUSCULAR | Status: DC | PRN
  Administered 2014-09-25: 5 mg via SUBCUTANEOUS
  Filled 2014-09-25: qty 1

## 2014-09-25 MED ORDER — SIMETHICONE 80 MG PO CHEW
80.0000 mg | CHEWABLE_TABLET | ORAL | Status: DC
  Administered 2014-09-27 (×2): 80 mg via ORAL
  Filled 2014-09-25 (×3): qty 1

## 2014-09-25 MED ORDER — ONDANSETRON HCL 4 MG/2ML IJ SOLN: 4.0000 mg | Freq: Three times a day (TID) | INTRAMUSCULAR | Status: DC | PRN

## 2014-09-25 MED ORDER — MENTHOL 3 MG MT LOZG: 1.0000 | LOZENGE | OROMUCOSAL | Status: DC | PRN

## 2014-09-25 MED ORDER — DIPHENHYDRAMINE HCL 50 MG/ML IJ SOLN: 12.5000 mg | INTRAMUSCULAR | Status: DC | PRN

## 2014-09-25 MED ORDER — LACTATED RINGERS IV SOLN
INTRAVENOUS | Status: DC | PRN
  Administered 2014-09-25: 12:00:00 via INTRAVENOUS

## 2014-09-25 MED ORDER — FENTANYL CITRATE 0.05 MG/ML IJ SOLN
25.0000 ug | INTRAMUSCULAR | Status: DC | PRN
  Administered 2014-09-25 (×3): 25 ug via INTRAVENOUS

## 2014-09-25 MED ORDER — MEPERIDINE HCL 25 MG/ML IJ SOLN
INTRAMUSCULAR | Status: DC | PRN
  Administered 2014-09-25: 12.5 mg via INTRAVENOUS

## 2014-09-25 MED ORDER — CEFAZOLIN SODIUM-DEXTROSE 2-3 GM-% IV SOLR
2.0000 g | Freq: Once | INTRAVENOUS | Status: DC
  Filled 2014-09-25: qty 50

## 2014-09-25 MED ORDER — METOCLOPRAMIDE HCL 5 MG/ML IJ SOLN: 10.0000 mg | Freq: Once | INTRAMUSCULAR | Status: DC | PRN

## 2014-09-25 MED ORDER — NALBUPHINE HCL 10 MG/ML IJ SOLN: 5.0000 mg | Freq: Once | INTRAMUSCULAR | Status: DC | PRN

## 2014-09-25 MED ORDER — FENTANYL CITRATE 0.05 MG/ML IJ SOLN
INTRAMUSCULAR | Status: AC
  Administered 2014-09-25: 25 ug via INTRAVENOUS
  Filled 2014-09-25: qty 2

## 2014-09-25 MED ORDER — PRENATAL MULTIVITAMIN CH
1.0000 | ORAL_TABLET | Freq: Every day | ORAL | Status: DC
  Administered 2014-09-26 – 2014-09-27 (×2): 1 via ORAL
  Filled 2014-09-25 (×2): qty 1

## 2014-09-25 MED ORDER — MEPERIDINE HCL 25 MG/ML IJ SOLN
INTRAMUSCULAR | Status: AC
  Filled 2014-09-25: qty 1

## 2014-09-25 MED ORDER — OXYTOCIN 40 UNITS IN LACTATED RINGERS INFUSION - SIMPLE MED: 62.5000 mL/h | INTRAVENOUS | Status: AC

## 2014-09-25 MED ORDER — ONDANSETRON HCL 4 MG/2ML IJ SOLN
INTRAMUSCULAR | Status: DC | PRN
  Administered 2014-09-25: 4 mg via INTRAVENOUS

## 2014-09-25 MED ORDER — SIMETHICONE 80 MG PO CHEW: 80.0000 mg | CHEWABLE_TABLET | ORAL | Status: DC | PRN

## 2014-09-25 MED ORDER — IBUPROFEN 600 MG PO TABS
600.0000 mg | ORAL_TABLET | Freq: Four times a day (QID) | ORAL | Status: DC
  Administered 2014-09-26 – 2014-09-28 (×10): 600 mg via ORAL
  Filled 2014-09-25 (×12): qty 1

## 2014-09-25 MED ORDER — PHENYLEPHRINE 40 MCG/ML (10ML) SYRINGE FOR IV PUSH (FOR BLOOD PRESSURE SUPPORT)
PREFILLED_SYRINGE | INTRAVENOUS | Status: AC
  Filled 2014-09-25: qty 5

## 2014-09-25 MED ORDER — LACTATED RINGERS IV SOLN
INTRAVENOUS | Status: DC | PRN
Start: 2014-09-25 — End: 2014-09-25
  Administered 2014-09-25 (×2): via INTRAVENOUS

## 2014-09-25 MED ORDER — TETANUS-DIPHTH-ACELL PERTUSSIS 5-2.5-18.5 LF-MCG/0.5 IM SUSP: 0.5000 mL | Freq: Once | INTRAMUSCULAR | Status: DC

## 2014-09-25 MED ORDER — OXYTOCIN 10 UNIT/ML IJ SOLN
40.0000 [IU] | INTRAVENOUS | Status: DC | PRN
  Administered 2014-09-25: 40 [IU] via INTRAVENOUS

## 2014-09-25 MED ORDER — ONDANSETRON HCL 4 MG/2ML IJ SOLN
INTRAMUSCULAR | Status: AC
  Filled 2014-09-25: qty 2

## 2014-09-25 MED ORDER — METOCLOPRAMIDE HCL 5 MG/ML IJ SOLN
INTRAMUSCULAR | Status: AC
Start: 2014-09-25 — End: 2014-09-25
  Filled 2014-09-25: qty 2

## 2014-09-25 MED ORDER — SIMETHICONE 80 MG PO CHEW
80.0000 mg | CHEWABLE_TABLET | Freq: Three times a day (TID) | ORAL | Status: DC
  Administered 2014-09-26 – 2014-09-28 (×6): 80 mg via ORAL
  Filled 2014-09-25 (×7): qty 1

## 2014-09-25 MED ORDER — OXYTOCIN 10 UNIT/ML IJ SOLN
INTRAMUSCULAR | Status: AC
  Filled 2014-09-25: qty 4

## 2014-09-25 MED ORDER — SODIUM BICARBONATE 8.4 % IV SOLN
INTRAVENOUS | Status: DC | PRN
  Administered 2014-09-25 (×2): 5 mL via EPIDURAL

## 2014-09-25 MED ORDER — OXYCODONE-ACETAMINOPHEN 5-325 MG PO TABS
1.0000 | ORAL_TABLET | ORAL | Status: DC | PRN
  Administered 2014-09-26 – 2014-09-27 (×2): 1 via ORAL
  Filled 2014-09-25 (×3): qty 1

## 2014-09-25 MED ORDER — SCOPOLAMINE 1 MG/3DAYS TD PT72
1.0000 | MEDICATED_PATCH | Freq: Once | TRANSDERMAL | Status: DC
Start: 2014-09-25 — End: 2014-09-25
  Administered 2014-09-25: 1.5 mg via TRANSDERMAL

## 2014-09-25 MED ORDER — KETOROLAC TROMETHAMINE 30 MG/ML IJ SOLN: 30.0000 mg | Freq: Four times a day (QID) | INTRAMUSCULAR | Status: DC | PRN

## 2014-09-25 MED ORDER — WITCH HAZEL-GLYCERIN EX PADS: 1.0000 "application " | MEDICATED_PAD | CUTANEOUS | Status: DC | PRN

## 2014-09-25 MED ORDER — MORPHINE SULFATE 0.5 MG/ML IJ SOLN
INTRAMUSCULAR | Status: AC
  Filled 2014-09-25: qty 10

## 2014-09-25 MED ORDER — MEPERIDINE HCL 25 MG/ML IJ SOLN: 6.2500 mg | INTRAMUSCULAR | Status: DC | PRN

## 2014-09-25 MED ORDER — DEXTROSE 5 % IV SOLN
1.0000 ug/kg/h | INTRAVENOUS | Status: DC | PRN
  Filled 2014-09-25: qty 2

## 2014-09-25 MED ORDER — CEFAZOLIN SODIUM-DEXTROSE 2-3 GM-% IV SOLR
INTRAVENOUS | Status: DC | PRN
  Administered 2014-09-25: 2 g via INTRAVENOUS

## 2014-09-25 MED ORDER — KETOROLAC TROMETHAMINE 30 MG/ML IJ SOLN
30.0000 mg | Freq: Four times a day (QID) | INTRAMUSCULAR | Status: DC | PRN
  Administered 2014-09-25: 30 mg via INTRAVENOUS

## 2014-09-25 MED ORDER — DIPHENHYDRAMINE HCL 25 MG PO CAPS: 25.0000 mg | ORAL_CAPSULE | Freq: Four times a day (QID) | ORAL | Status: DC | PRN

## 2014-09-25 MED ORDER — ONDANSETRON HCL 4 MG PO TABS: 4.0000 mg | ORAL_TABLET | ORAL | Status: DC | PRN

## 2014-09-25 MED ORDER — KETOROLAC TROMETHAMINE 30 MG/ML IJ SOLN
30.0000 mg | Freq: Once | INTRAMUSCULAR | Status: AC
  Administered 2014-09-25: 30 mg via INTRAVENOUS
  Filled 2014-09-25: qty 1

## 2014-09-25 MED ORDER — ZOLPIDEM TARTRATE 5 MG PO TABS: 5.0000 mg | ORAL_TABLET | Freq: Every evening | ORAL | Status: DC | PRN

## 2014-09-25 MED ORDER — SENNOSIDES-DOCUSATE SODIUM 8.6-50 MG PO TABS
2.0000 | ORAL_TABLET | ORAL | Status: DC
  Administered 2014-09-27 (×2): 2 via ORAL
  Filled 2014-09-25 (×3): qty 2

## 2014-09-25 MED ORDER — KETOROLAC TROMETHAMINE 30 MG/ML IJ SOLN
INTRAMUSCULAR | Status: AC
  Administered 2014-09-25: 30 mg via INTRAVENOUS
  Filled 2014-09-25: qty 1

## 2014-09-25 MED ORDER — METOCLOPRAMIDE HCL 5 MG/ML IJ SOLN
INTRAMUSCULAR | Status: DC | PRN
  Administered 2014-09-25: 10 mg via INTRAVENOUS

## 2014-09-25 MED ORDER — OXYCODONE-ACETAMINOPHEN 5-325 MG PO TABS: 2.0000 | ORAL_TABLET | ORAL | Status: DC | PRN

## 2014-09-25 MED ORDER — SODIUM CHLORIDE 0.9 % IJ SOLN: 3.0000 mL | INTRAMUSCULAR | Status: DC | PRN

## 2014-09-25 MED ORDER — NALOXONE HCL 0.4 MG/ML IJ SOLN: 0.4000 mg | INTRAMUSCULAR | Status: DC | PRN

## 2014-09-25 SURGICAL SUPPLY — 35 items
CLAMP CORD UMBIL (MISCELLANEOUS) IMPLANT
CLOTH BEACON ORANGE TIMEOUT ST (SAFETY) ×2 IMPLANT
DRAPE SHEET LG 3/4 BI-LAMINATE (DRAPES) IMPLANT
DRSG OPSITE POSTOP 4X10 (GAUZE/BANDAGES/DRESSINGS) ×2 IMPLANT
DURAPREP 26ML APPLICATOR (WOUND CARE) ×2 IMPLANT
ELECT REM PT RETURN 9FT ADLT (ELECTROSURGICAL) ×2
ELECTRODE REM PT RTRN 9FT ADLT (ELECTROSURGICAL) ×1 IMPLANT
EXTRACTOR VACUUM M CUP 4 TUBE (SUCTIONS) IMPLANT
GLOVE BIO SURGEON STRL SZ8.5 (GLOVE) ×3 IMPLANT
GOWN STRL REUS W/TWL 2XL LVL3 (GOWN DISPOSABLE) ×2 IMPLANT
GOWN STRL REUS W/TWL LRG LVL3 (GOWN DISPOSABLE) ×3 IMPLANT
KIT ABG SYR 3ML LUER SLIP (SYRINGE) IMPLANT
LIQUID BAND (GAUZE/BANDAGES/DRESSINGS) ×2 IMPLANT
NDL HYPO 25X5/8 SAFETYGLIDE (NEEDLE) ×1 IMPLANT
NEEDLE HYPO 25X5/8 SAFETYGLIDE (NEEDLE) ×2 IMPLANT
NS IRRIG 1000ML POUR BTL (IV SOLUTION) ×2 IMPLANT
PACK C SECTION WH (CUSTOM PROCEDURE TRAY) ×2 IMPLANT
PAD OB MATERNITY 4.3X12.25 (PERSONAL CARE ITEMS) ×2 IMPLANT
RETRACTOR WND ALEXIS 25 LRG (MISCELLANEOUS) IMPLANT
RTRCTR WOUND ALEXIS 25CM LRG (MISCELLANEOUS) ×2
STAPLER VISISTAT 35W (STAPLE) IMPLANT
SUT CHROMIC 0 CT 802H (SUTURE) ×2 IMPLANT
SUT CHROMIC 0 MO4 CR (SUTURE) IMPLANT
SUT CHROMIC 1 CTX 36 (SUTURE) ×4 IMPLANT
SUT CHROMIC 2 0 SH (SUTURE) ×2 IMPLANT
SUT GUT PLAIN 0 CT-3 TAN 27 (SUTURE) IMPLANT
SUT MON AB 4-0 PS1 27 (SUTURE) ×2 IMPLANT
SUT PDS AB 0 CTX 36 PDP370T (SUTURE) IMPLANT
SUT VIC AB 0 CT1 18XCR BRD8 (SUTURE) IMPLANT
SUT VIC AB 0 CT1 8-18 (SUTURE)
SUT VIC AB 0 CTX 36 (SUTURE) ×4
SUT VIC AB 0 CTX36XBRD ANBCTRL (SUTURE) ×2 IMPLANT
TOWEL OR 17X24 6PK STRL BLUE (TOWEL DISPOSABLE) ×2 IMPLANT
TRAY FOLEY CATH 14FR (SET/KITS/TRAYS/PACK) ×1 IMPLANT
WATER STERILE IRR 1000ML POUR (IV SOLUTION) ×2 IMPLANT

## 2014-09-25 NOTE — Progress Notes (Signed)
Patient ID: Julia Owens, female   DOB: 1986-07-31, 28 y.o.   MRN: [id number] Since 6am 8-9 cm -1 station no change since will be delivered by cs for failure to progress in labor blood sugar 123

## 2014-09-25 NOTE — Transfer of Care (Signed)
Immediate Anesthesia Transfer of Care Note  Patient: Julia Owens  Procedure(s) Performed: Procedure(s): CESAREAN SECTION (N/A)  Patient Location: PACU  Anesthesia Type:Epidural  Level of Consciousness: awake, alert , oriented and patient cooperative  Airway & Oxygen Therapy: Patient Spontanous Breathing  Post-op Assessment: Report given to PACU RN and Post -op Vital signs reviewed and stable  Post vital signs: Reviewed and stable  Complications: No apparent anesthesia complications

## 2014-09-25 NOTE — Progress Notes (Signed)
Patient ID: Julia Owens, female   DOB: 1986-06-17, 28 y.o.   MRN: [id number] Cervix is now 7 cm 100% effaced vertex -1 station her contractions every minute to a minute and a half an IUPC was inserted to judge strength for contractions otherwise no problem

## 2014-09-25 NOTE — Op Note (Signed)
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  The patient concurred with the proposed plan, giving informed consent with form signed.   The patient was taken to Operating Room, and identified with the procedure verified as C-Section Delivery with Time Out. With induction of anesthesia, the patient was prepped and draped in the usual sterile fashion. A Pfannenstiel incision was made and carried down through the subcutaneous tissue to the fascia. The fascia was incised in the midline and extended transversely. The superior aspect of the fascial incision was grasped with Kochers elevated and the underlying muscle dissected off. The inferior aspect of the facial incision was in similar fashion, grasped elevated and rectus muscles dissected off. The peritoneum was identified and entered. Peritoneal incision was extended longitudinally. The utero-vesical peritoneal reflection was identified and incised transversely with the [facility] scissors, the incision extended laterally, the bladder flap created digitally. A low transverse uterine incision was made and the infants head delivered atraumatically. After the umbilical cord was clamped and cut cord blood was obtained for evaluation.   The placenta was removed intact and appeared normal. The uterine outline, tubes and ovaries appeared normal. The uterine incision was closed with running locked sutures of 0 Vicryl and a second layer of the same stitch was used in an imbricating fashion.  Excellent hemostasis was obtained.  The pericolic gutters were then cleared of all clots and debris. The fascia was then reapproximated with  running sutures of 0 Vicryl. The subcutaneous tissue was reapproximated with 2-0 plain gut suture.   The skin was closed with 4-0 vicryl in a subcuticular fashion.  Instrument, sponge, and needle counts were correct prior the abdominal closure and at the conclusion of the case. The patient was taken to recovery in stable condition.

## 2014-09-25 NOTE — Anesthesia Postprocedure Evaluation (Signed)
  Anesthesia Post-op Note  Patient: Julia Owens  Procedure(s) Performed: Procedure(s): CESAREAN SECTION (N/A)  Patient Location: PACU  Anesthesia Type:Epidural  Level of Consciousness: awake, alert  and oriented  Airway and Oxygen Therapy: Patient Spontanous Breathing  Post-op Pain: none  Post-op Assessment: Post-op Vital signs reviewed, Patient's Cardiovascular Status Stable, Respiratory Function Stable, Patent Airway, No signs of Nausea or vomiting, Pain level controlled, No headache and No backache  Post-op Vital Signs: Reviewed and stable  Last Vitals:  Filed Vitals:   09/25/14 1356  BP:   Pulse: 82  Temp:   Resp: 18    Complications: No apparent anesthesia complications

## 2014-09-25 NOTE — Lactation Note (Addendum)
This note was copied from the chart of Julia Owens. Lactation Consultation Note Initial visit at 53 hours of age.  MOm reports baby just finished a feeding on and off for about 10 minutes.  Mom is having nausea and vomiting and is very sleepy, closing her eyes during visit.  [facility] LC resources given and discussed.  Encouraged to feed with early cues on demand.  Early newborn behavior discussed.  Hand expression demonstrated with colostrum visible. Mom requests hand pump, but instructions not given at this time due to mom being groggy.Mom will need follow up basic teaching when she is more alert.  Mom to call for assist as needed.      Patient Name: Julia Hilarie Mena Today's Date: 09/25/2014 Reason for consult: Initial assessment   Maternal Data Has patient been taught Hand Expression?: Yes  Feeding Feeding Type: Breast Fed Length of feed: 15 min (on and off)  LATCH Score/Interventions                      Lactation Tools Discussed/Used WIC Program: Yes Initiated by:: JS Date initiated:: 09/25/14   Consult Status Consult Status: Follow-up Date: 09/26/14 Follow-up type: In-patient    Christinea Brizuela, Justine Null 09/25/2014, 10:30 PM

## 2014-09-26 ENCOUNTER — Encounter (HOSPITAL_COMMUNITY): Payer: Self-pay | Admitting: Obstetrics

## 2014-09-26 LAB — CBC
HCT: 28.9 % — ABNORMAL LOW (ref 36.0–46.0)
Hemoglobin: 10.4 g/dL — ABNORMAL LOW (ref 12.0–15.0)
MCH: 28.8 pg (ref 26.0–34.0)
MCHC: 36 g/dL (ref 30.0–36.0)
MCV: 80.1 fL (ref 78.0–100.0)
PLATELETS: 205 10*3/uL (ref 150–400)
RBC: 3.61 MIL/uL — AB (ref 3.87–5.11)
RDW: 14.5 % (ref 11.5–15.5)
WBC: 17.4 10*3/uL — AB (ref 4.0–10.5)

## 2014-09-26 MED ORDER — METRONIDAZOLE 0.75 % EX CREA: TOPICAL_CREAM | Freq: Two times a day (BID) | CUTANEOUS | Status: DC

## 2014-09-26 MED ORDER — MICONAZOLE NITRATE 2 % EX CREA
TOPICAL_CREAM | Freq: Every day | CUTANEOUS | Status: DC
  Administered 2014-09-26 – 2014-09-28 (×3): via TOPICAL
  Filled 2014-09-26: qty 14

## 2014-09-26 NOTE — Addendum Note (Signed)
Addendum  created 09/26/14 0827 by Alvy Bimler, CRNA   Modules edited: Charges VN, Notes Section   Notes Section:  File: [id number]

## 2014-09-26 NOTE — Progress Notes (Signed)
Patient ID: Julia Owens, female   DOB: October 11, 1985, 28 y.o.   MRN: [id number]

## 2014-09-26 NOTE — Lactation Note (Signed)
This note was copied from the chart of Julia Adrien Dockham. Lactation Consultation Note       Follow up consult with this mom of a term baby, now 91 hours old. Mom reports breast feeding going well, baby cluster fed most of the night, and fed a few time today also. She has had multiple wet and dirty diapers. Mom knows to call for questions/concerns.  Patient Name: Julia Owens Today's Date: 09/26/2014 Reason for consult: Follow-up assessment   Maternal Data    Feeding Feeding Type: Breast Fed Length of feed: 20 min  LATCH Score/Interventions Latch: Grasps breast easily, tongue down, lips flanged, rhythmical sucking. Intervention(s): Adjust position  Audible Swallowing: Spontaneous and intermittent  Type of Nipple: Everted at rest and after stimulation  Comfort (Breast/Nipple): Soft / non-tender     Hold (Positioning): Assistance needed to correctly position infant at breast and maintain latch.  LATCH Score: 9  Lactation Tools Discussed/Used     Consult Status Consult Status: Follow-up Date: 09/27/14 Follow-up type: In-patient    Tonna Corner 09/26/2014, 3:13 PM

## 2014-09-26 NOTE — Anesthesia Postprocedure Evaluation (Signed)
  Anesthesia Post-op Note  Patient: Acsa Faulks  Procedure(s) Performed: Procedure(s): CESAREAN SECTION (N/A)  Patient Location: PACU and Mother/Baby  Anesthesia Type:Epidural  Level of Consciousness: awake, alert , oriented and patient cooperative  Airway and Oxygen Therapy: Patient Spontanous Breathing  Post-op Pain: none  Post-op Assessment: Post-op Vital signs reviewed, Patient's Cardiovascular Status Stable, Respiratory Function Stable, Patent Airway, No signs of Nausea or vomiting, Adequate PO intake, Pain level controlled, No headache, No backache, No residual numbness and No residual motor weakness  Post-op Vital Signs: Reviewed and stable  Last Vitals:  Filed Vitals:   09/26/14 0750  BP: 134/66  Pulse: 86  Temp: 37 C  Resp: 20    Complications: No apparent anesthesia complications

## 2014-09-27 NOTE — Progress Notes (Signed)
Patient ID: Julia Owens, female   DOB: Sep 16, 1986, 29 y.o.   MRN: [id number] Postop day 2 Vital signs normal Fundus firm Legs negative Doing well

## 2014-09-27 NOTE — Lactation Note (Addendum)
This note was copied from the chart of Julia Owens. Lactation Consultation Note  Patient Name: Julia Purity Beshears Today's Date: 09/27/2014 Reason for consult: Follow-up assessment;Infant weight loss (per mom plans to take a shower before feeding , LC encouraged to call for assist )  Discussed  With mom 7 % weight loss and noted baby has had many wets and stools. Per mom latching has improved with MBU RN's assistance. MBU RN - Despina Pole in the room and also mentioned baby was latching well. Due to weight loss LC suggested prior to latch - breast massage , hand express,  Pre-pump with the hand pump to prime the milk ducts and with latching use the breast compression technique at latch. And intermittent compressions with latch. LC instructed mom on the use of hand pump , and increase the flange if mom needs it, ( LC explained to mom if the #24 Flange is to snug when pre pumping to increase to #27.     Maternal Data    Feeding Feeding Type: Breast Fed  LATCH Score/Interventions Latch: Repeated attempts needed to sustain latch, nipple held in mouth throughout feeding, stimulation needed to elicit sucking reflex. Intervention(s): Adjust position;Assist with latch;Breast massage;Breast compression  Audible Swallowing: A few with stimulation Intervention(s): Skin to skin;Hand expression Intervention(s): Skin to skin;Hand expression  Type of Nipple: Everted at rest and after stimulation  Comfort (Breast/Nipple): Soft / non-tender     Hold (Positioning): Assistance needed to correctly position infant at breast and maintain latch. Intervention(s): Breastfeeding basics reviewed;Support Pillows;Position options;Skin to skin  LATCH Score: 7  Lactation Tools Discussed/Used Tools: Pump;Flanges Breast pump type: Manual   Consult Status Consult Status: Follow-up Date: 09/28/14 Follow-up type: In-patient    Myer Haff 09/27/2014, 4:38 PM

## 2014-09-27 NOTE — Progress Notes (Signed)
Education handout given with instructions of signs and symptoms of pre-eclampsia

## 2014-09-28 NOTE — Progress Notes (Signed)
Patient ID: Julia Owens, female   DOB: [birth year], 29 y.o.   MRN: [id number] Postop day 3 Vital signs normal Fundus firm Dressing dry Legs negative home today

## 2014-09-28 NOTE — Discharge Summary (Signed)
Obstetric Discharge Summary Reason for Admission: induction of labor Prenatal Procedures: NST Intrapartum Procedures: cesarean: low cervical, transverse Postpartum Procedures: none Complications-Operative and Postpartum: none HEMOGLOBIN  Date Value Ref Range Status  09/26/2014 10.4* 12.0 - 15.0 g/dL Final    Comment:    REPEATED TO VERIFY DELTA CHECK NOTED    HCT  Date Value Ref Range Status  09/26/2014 28.9* 36.0 - 46.0 % Final    Physical Exam:  General: alert Lochia: appropriate Uterine Fundus: firm Incision: healing well DVT Evaluation: No evidence of DVT seen on physical exam.  Discharge Diagnoses: Term Pregnancy-delivered  Discharge Information: Date: 09/28/2014 Activity: unrestricted Diet: routine Medications: Percocet Condition: stable Instructions: refer to practice specific booklet Discharge to: home Follow-up Information    Follow up with Frederico Hamman, MD.   Specialty:  Obstetrics and Gynecology   Contact information:   [address] [place] [address] [phone]       Newborn Data: Live born female  Birth Weight: 7 lb 14 oz (3572 g) APGAR: 9, 9  Home with mother.  MARSHALL,BERNARD A 09/28/2014, 6:33 AM

## 2014-09-28 NOTE — Lactation Note (Signed)
This note was copied from the chart of Julia Shronda Latouche. Lactation Consultation Note  12 % wt loss. Baby recently given 30 ml of formula, sleeping on mother's chest. Reviewed supply, demand, engorgement care and showed mother Baby & Me booklet pg 24 to monitor voids/stools. Encouraged mother to breastfeed first then supplement w/ formula.  Peds MD recommends 30 ml after each breastfeeding session. Suggested mother continue to breast feeding with feeding cues but supplement after to establish her milk supply. Encouraged mother to post pump to boost her milk supply.  Mother has hand pump.     Patient Name: Julia Owens Today's Date: 09/28/2014 Reason for consult: Infant weight loss   Maternal Data    Feeding Feeding Type: Formula Nipple Type: Slow - flow Length of feed: 10 min  LATCH Score/Interventions Latch: Grasps breast easily, tongue down, lips flanged, rhythmical sucking. Intervention(s): Adjust position;Assist with latch  Audible Swallowing: A few with stimulation Intervention(s): Alternate breast massage  Type of Nipple: Everted at rest and after stimulation  Comfort (Breast/Nipple): Soft / non-tender     Hold (Positioning): Assistance needed to correctly position infant at breast and maintain latch. Intervention(s): Breastfeeding basics reviewed  LATCH Score: 8  Lactation Tools Discussed/Used     Consult Status Consult Status: Complete    Carlye Grippe 09/28/2014, 10:31 AM

## 2014-09-28 NOTE — Discharge Instructions (Signed)
Discharge instructions   You can wash your hair  Shower  Eat what you want  Drink what you want  See me in 6 weeks  Your ankles are going to swell more in the next 2 weeks than when pregnant  No sex for 6 weeks   Jaedan Huttner A, MD 09/28/2014

## 2014-09-30 ENCOUNTER — Inpatient Hospital Stay (HOSPITAL_COMMUNITY)
Admission: AD | Admit: 2014-09-30 | Payer: No Typology Code available for payment source | Source: Ambulatory Visit | Admitting: Obstetrics

## 2014-12-17 IMAGING — US US OB FOLLOW-UP
1 series · 12 of 28 positions shown · non-contrast
Comparison: none

[Series 1: us ob follow-up · 0.17mm/px · 12 of 36 slices shown]
[im 2/36]
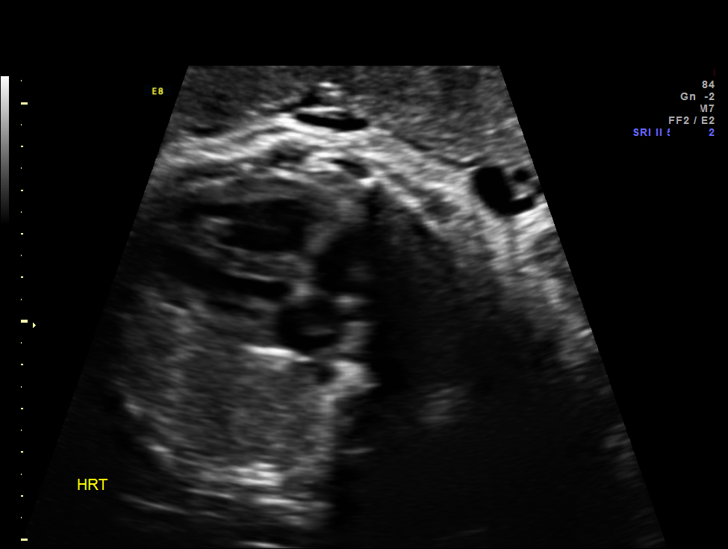
[im 4/36]
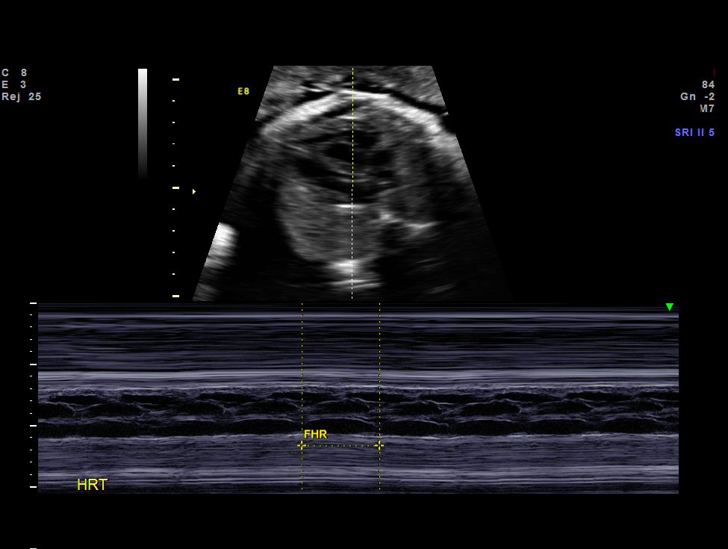
[im 7/36]
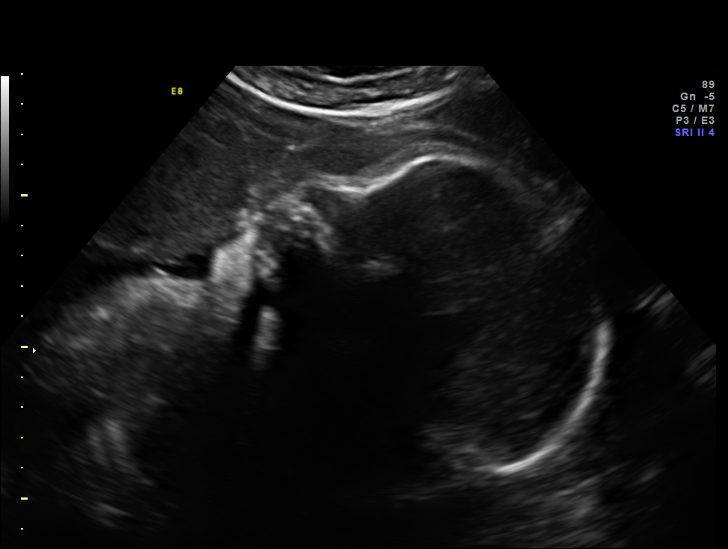
[im 11/36]
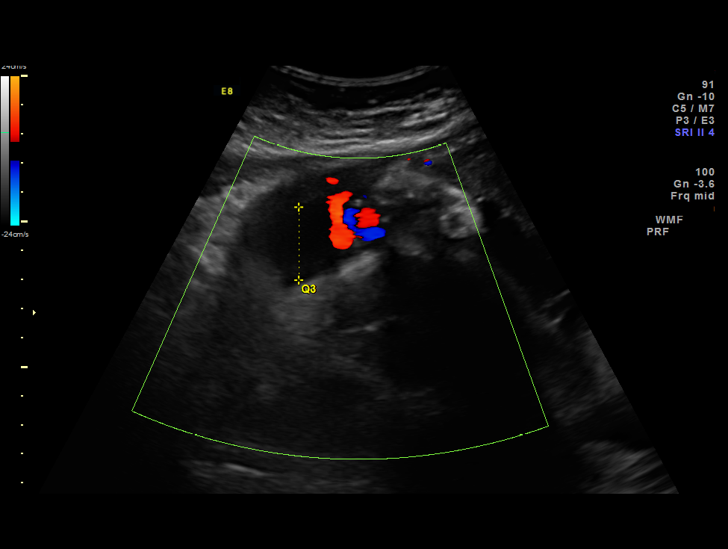
[im 13/36]
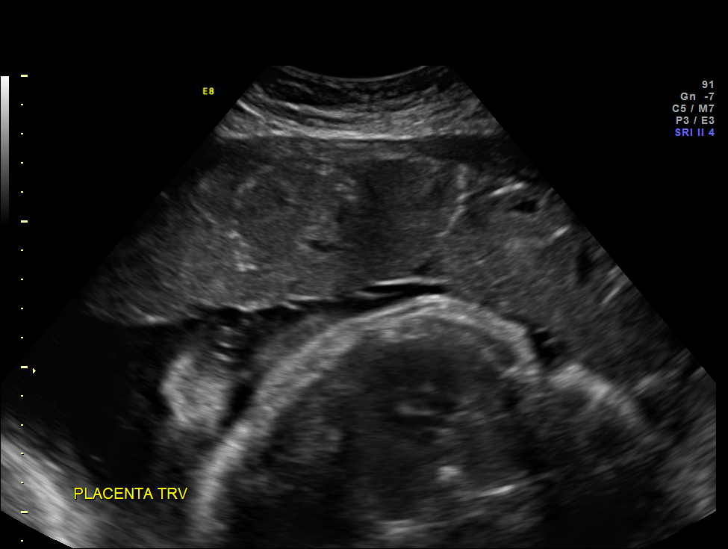
[im 16/36]
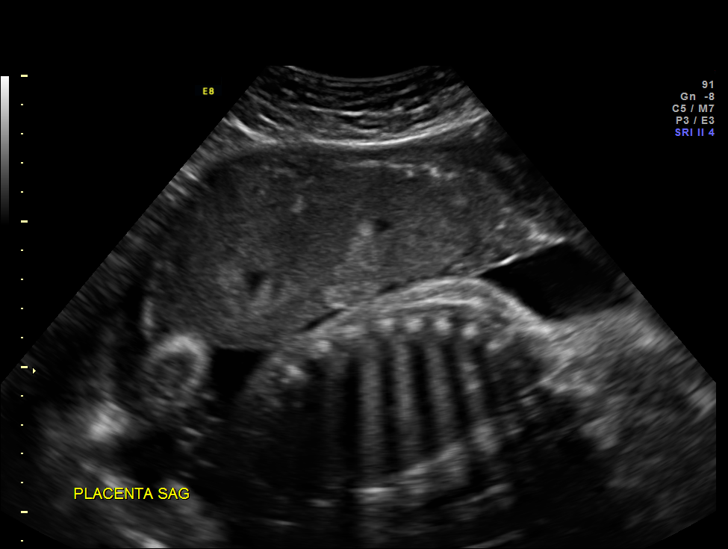
[im 20/36]
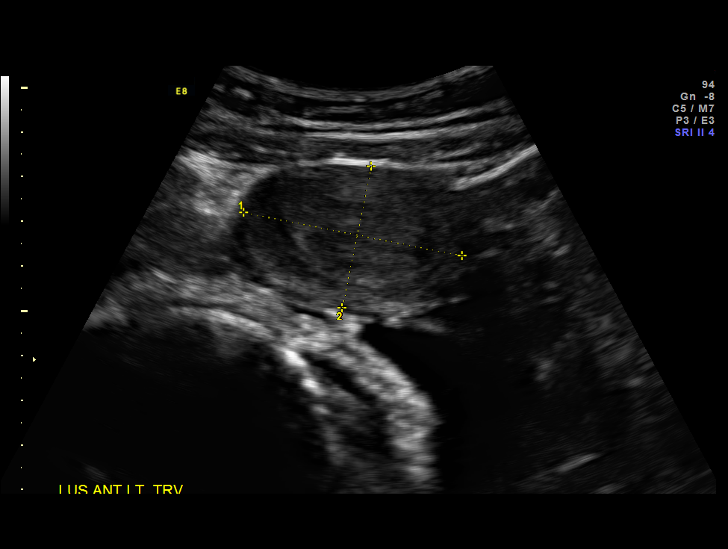
[im 23/36]
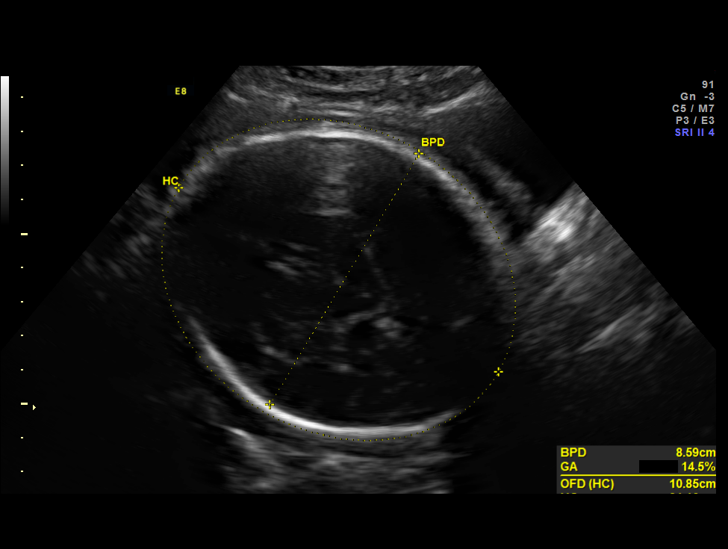
[im 25/36]
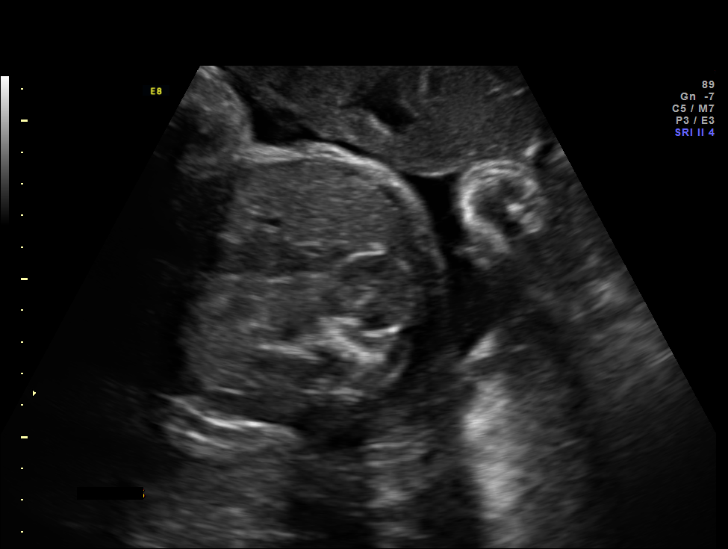
[im 29/36]
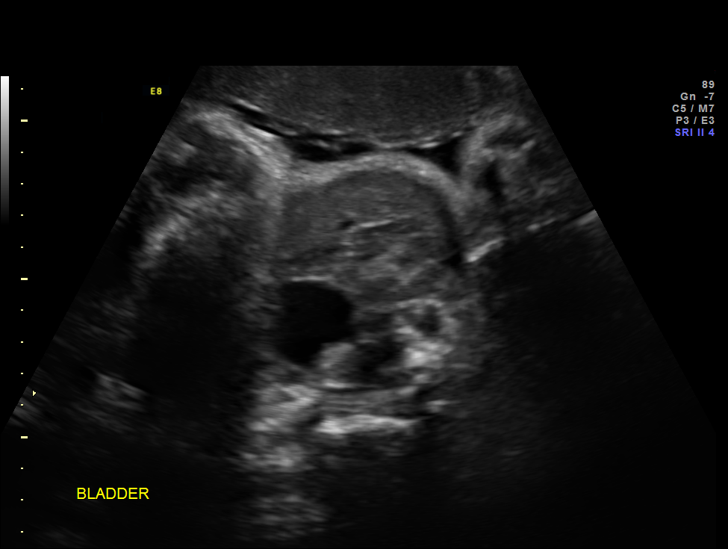
[im 32/36]
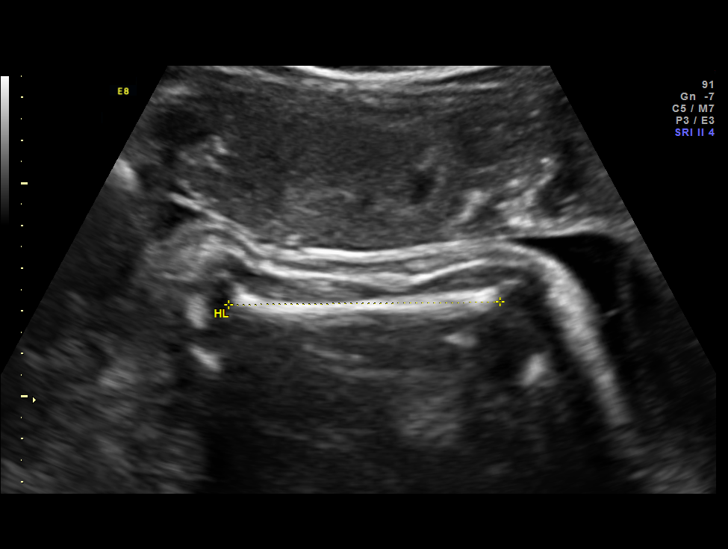
[im 34/36]
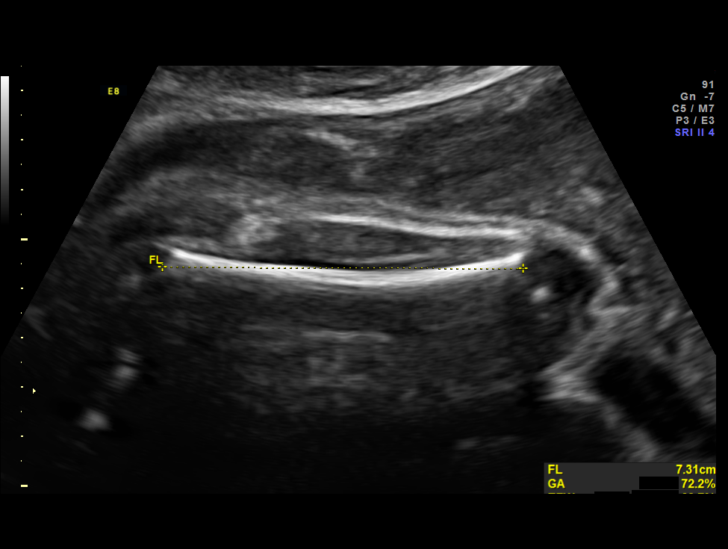

[12 of 28 positions shown; findings below may reference images not displayed]

OBSTETRICS REPORT
                      (Signed Final 09/05/2014 [DATE])

Service(s) Provided

 US OB FOLLOW UP                                       76816.1
Indications

 Gestational diabetes in pregnancy, diet controlled
 36 weeks gestation of pregnancy
Fetal Evaluation

 Num Of Fetuses:    1
 Fetal Heart Rate:  130                          bpm
 Cardiac Activity:  Observed
 Presentation:      Cephalic
 Placenta:          Posterior, above cervical
                    os
 P. Cord            Previously Visualized
 Insertion:

 Amniotic Fluid
 AFI FV:      Subjectively within normal limits
 AFI Sum:     18.25   cm       69  %Tile     Larg Pckt:    7.74  cm
 RUQ:   5.75    cm   RLQ:    7.74   cm    LUQ:   2.25    cm   LLQ:    2.51   cm
Biometry

 BPD:     86.2  mm     G. Age:  34w 5d                CI:         80.7   70 - 86
 OFD:    106.8  mm                                    FL/HC:      23.6   20.1 -

 HC:     308.6  mm     G. Age:  34w 3d      < 3  %    HC/AC:      0.96   0.93 -

 AC:     321.5  mm     G. Age:  36w 0d       51  %    FL/BPD:     84.5   71 - 87
 FL:      72.8  mm     G. Age:  37w 2d       68  %    FL/AC:      22.6   20 - 24
 HUM:     63.3  mm     G. Age:  36w 5d       71  %

 Est. FW:    7819  gm      6 lb 4 oz     58  %
Gestational Age

 U/S Today:     35w 4d                                        EDD:   10/06/14
 Best:          36w 3d     Det. By:  Early Ultrasound         EDD:   09/30/14
Anatomy

 Cranium:          Appears normal         Aortic Arch:      Previously seen
 Fetal Cavum:      Previously seen        Ductal Arch:      Appears normal
 Ventricles:       Previously seen        Diaphragm:        Appears normal
 Choroid Plexus:   Previously seen        Stomach:          Previously Seen
 Cerebellum:       Previously seen        Abdomen:          Appears normal
 Posterior Fossa:  Previously seen        Abdominal Wall:   Previously seen
 Nuchal Fold:      Not applicable (>20    Cord Vessels:     Previously seen
                   wks GA)
 Face:             Orbits and profile     Kidneys:          Appear normal
                   previously seen
 Lips:             Previously seen        Bladder:          Appears normal
 Heart:            Appears normal         Spine:            Not well visualized
                   (4CH, axis, and
                   situs)
 RVOT:             Previously seen        Lower             Previously seen
                                          Extremities:
 LVOT:             Previously seen        Upper             Previously seen
                                          Extremities:

 Other:  Female gender previously seen. Heels previously visualized. Nasal
         bone previously visualized. Technically difficult due to advanced GA
         and fetal position.
Cervix Uterus Adnexa

 Cervix:       Normal appearance by transabdominal scan.
 Uterus:       Single fibroid noted, see table below.
 Left Ovary:    Within normal limits.
 Right Ovary:   Within normal limits.

 Adnexa:     No abnormality visualized.
Myomas

 Site                     L(cm)      W(cm)       D(cm)      Location
 Anterior LT LUS          4          5

 Blood Flow                  RI       PI       Comments

Comments

 Reviewed fingerstick values.  Majority of fasting values within
 target range, although having some values > 90.  Multiple
 post prandial values > 130, mostly in the late evening (HS).
 Will being Glyburide 2.5 mg BID.
Impression

 Single IUP at 36w 3d
 A2 GDM - now on glyburide
 Normal interval anatomy.  Somewhat limited views of the fetal
 spine obtained due to fetal position.
 Fetal growth is appropriate (58th %tile)
 Anterior LUS myoma noted as described above.
 Posterior placenta without previa
 Normal amniotic fluid volume
Recommendations

 Recommend 2x weekly NSTs with weekly AFIs.
 Delivery at 39 weeks due to A2 GDM.
 questions or concerns.

## 2015-02-12 NOTE — Telephone Encounter (Signed)
Pt tried to pickup glucometer & supplies last week was told cost $185.00. Problem with order resolved. Will be available for pickup tomorrow 07/16/14 @ 1:00pm.

## 2015-06-20 ENCOUNTER — Telehealth: Payer: Self-pay | Admitting: *Deleted

## 2015-06-20 ENCOUNTER — Other Ambulatory Visit: Payer: Self-pay | Admitting: *Deleted

## 2015-06-20 DIAGNOSIS — D259 Leiomyoma of uterus, unspecified: Secondary | ICD-10-CM

## 2015-06-20 NOTE — Telephone Encounter (Signed)
Ultrasound appointment 06/30/15 @ 1:00pm.  Pt has been referred by [facility] for fibroids.  Pt needs to be informed of referral and appointments.  Attempted to contact patient, no answer, left message for patient to return call to the clinic.

## 2015-06-25 ENCOUNTER — Encounter: Payer: Self-pay | Admitting: *Deleted

## 2015-06-25 NOTE — Telephone Encounter (Signed)
Attempted to contact patient with ultrasound date/time. No answer, left detailed message concerning ultrasound appointment with instructions.  Will send letter.  Letter sent.

## 2015-06-30 ENCOUNTER — Ambulatory Visit (HOSPITAL_COMMUNITY): Payer: Medicaid Other

## 2015-07-08 ENCOUNTER — Ambulatory Visit (HOSPITAL_COMMUNITY)
Admission: RE | Admit: 2015-07-08 | Discharge: 2015-07-08 | Disposition: A | Discharge status: Home / Self Care | Payer: Medicaid Other | Source: Ambulatory Visit | Attending: Obstetrics & Gynecology | Admitting: Obstetrics & Gynecology

## 2015-07-08 DIAGNOSIS — E282 Polycystic ovarian syndrome: Secondary | ICD-10-CM

## 2015-07-08 DIAGNOSIS — N912 Amenorrhea, unspecified: Secondary | ICD-10-CM | POA: Insufficient documentation

## 2015-07-08 DIAGNOSIS — R102 Pelvic and perineal pain: Secondary | ICD-10-CM | POA: Insufficient documentation

## 2015-07-08 DIAGNOSIS — D219 Benign neoplasm of connective and other soft tissue, unspecified: Secondary | ICD-10-CM | POA: Insufficient documentation

## 2015-07-08 DIAGNOSIS — D259 Leiomyoma of uterus, unspecified: Secondary | ICD-10-CM

## 2015-07-09 ENCOUNTER — Telehealth: Payer: Self-pay | Admitting: General Practice

## 2015-07-09 NOTE — Telephone Encounter (Signed)
Per Dr Harolyn Rutherford, patient's ultrasound shows polycystic ovaries (concerning for PCOS especially given her amenorrhea) and small fibroids (largest 2.4 cm). She can discuss management of PCOS and fibroids with Dr. Roselie Awkward during her visit on 07/23/15. Called patient and informed her of results and recommendations. Patient verbalized understanding and states she is unsure if she can make that appointment. Told patient I will send her information to the front office staff and they will contact her to reschedule. Patient verbalized understanding and had no questions

## 2015-07-22 ENCOUNTER — Encounter: Payer: Self-pay | Admitting: *Deleted

## 2015-07-23 ENCOUNTER — Encounter: Payer: Medicaid Other | Admitting: Obstetrics & Gynecology

## 2015-07-31 ENCOUNTER — Encounter: Payer: Medicaid Other | Admitting: Obstetrics & Gynecology

## 2015-08-01 ENCOUNTER — Ambulatory Visit (INDEPENDENT_AMBULATORY_CARE_PROVIDER_SITE_OTHER): Payer: Self-pay | Admitting: Family Medicine

## 2015-08-01 ENCOUNTER — Encounter: Payer: Self-pay | Admitting: Family Medicine

## 2015-08-01 VITALS — BP 126/87 | HR 95 | Temp 98.6°F | Resp 18 | Ht 63.0 in | Wt 188.2 lb

## 2015-08-01 DIAGNOSIS — D259 Leiomyoma of uterus, unspecified: Secondary | ICD-10-CM

## 2015-08-01 DIAGNOSIS — B3731 Acute candidiasis of vulva and vagina: Secondary | ICD-10-CM

## 2015-08-01 DIAGNOSIS — B373 Candidiasis of vulva and vagina: Secondary | ICD-10-CM

## 2015-08-01 MED ORDER — FLUCONAZOLE 150 MG PO TABS: 150.0000 mg | ORAL_TABLET | Freq: Once | ORAL | Status: DC

## 2015-08-01 NOTE — Progress Notes (Signed)
   Subjective:    Patient ID: Julia Owens, female    DOB: 08-10-1986, 28 y.o.   MRN: [id number]  HPI Patient seen for fibroids and bleeding.  Patient delivered via cesarean section in December 2015.  Had dilated to 9cm, then arrest of dilation.  Noted to have fibroids on cesarean section.  Started depo provera in April.  Had continual bleeding, as much as a normal period, daily until the depo wore off in July.  Bleeding stopped in August.  Has occasional spotting.    Menstrual history: Had heavy periods every month prior to being pregnancy.  Bleeds for 4-7 days.  Started menses at age 39.     Review of Systems  All other systems reviewed and are negative.   I have reviewed the patients past medical, family, and social history.  I have reviewed the patient's medication list and allergies.     Objective:   Physical Exam  Constitutional: She is oriented to person, place, and time. She appears well-developed and well-nourished.  HENT:  Head: Normocephalic and atraumatic.  Right Ear: External ear normal.  Left Ear: External ear normal.  Cardiovascular: Normal rate, regular rhythm and normal heart sounds.  Exam reveals no gallop and no friction rub.   No murmur heard. Pulmonary/Chest: Effort normal and breath sounds normal. No respiratory distress. She has no wheezes. She has no rales. She exhibits no tenderness.  Abdominal: Soft. Bowel sounds are normal. She exhibits no distension and no mass. There is no tenderness. There is no rebound and no guarding. Hernia confirmed negative in the right inguinal area and confirmed negative in the left inguinal area.  Genitourinary: There is no rash, tenderness, lesion or injury on the right labia. There is no rash, tenderness, lesion or injury on the left labia. Uterus is enlarged (6 week size). Uterus is not deviated, not fixed and not tender. Cervix exhibits no motion tenderness, no discharge and no friability. Right adnexum displays no mass, no  tenderness and no fullness. Left adnexum displays no mass, no tenderness and no fullness. No erythema, tenderness or bleeding in the vagina. No foreign body around the vagina. No signs of injury around the vagina. Vaginal discharge (Clumpy white discharge consistent with yeast) found.  Lymphadenopathy:       Right: No inguinal adenopathy present.       Left: No inguinal adenopathy present.  Neurological: She is alert and oriented to person, place, and time.  Skin: Skin is warm and dry.  Psychiatric: She has a normal mood and affect. Her behavior is normal. Judgment and thought content normal.       Assessment & Plan:  1. Uterine leiomyoma, unspecified location As the patient's bleeding has resolved, concentration on contraceptive management is the goal. I discussed with the patient options of oral contraception, IUD, NuvaRing.  Patient would prefer not have hormonal birth control at this time and was satisfied with using condoms. I discussed with her reasons to return, including abnormal bleeding, pelvic pain, amenorrhea for one year, or desire to change birth control methods.  I'm uncertain as to the validity of her diagnosis of PCOS given that she has not really been amenorrheic for 1 year given her recent childbirth and Depo-Provera.  2. Yeast vaginitis Diflucan prescribed.

## 2015-09-08 ENCOUNTER — Emergency Department (HOSPITAL_COMMUNITY)
Admission: EM | Admit: 2015-09-08 | Discharge: 2015-09-09 | Disposition: A | Discharge status: Home / Self Care | Payer: Medicaid Other | Attending: Emergency Medicine | Admitting: Emergency Medicine

## 2015-09-08 ENCOUNTER — Encounter (HOSPITAL_COMMUNITY): Payer: Self-pay

## 2015-09-08 DIAGNOSIS — Z79899 Other long term (current) drug therapy: Secondary | ICD-10-CM | POA: Insufficient documentation

## 2015-09-08 DIAGNOSIS — Z3202 Encounter for pregnancy test, result negative: Secondary | ICD-10-CM | POA: Insufficient documentation

## 2015-09-08 DIAGNOSIS — Z8632 Personal history of gestational diabetes: Secondary | ICD-10-CM | POA: Insufficient documentation

## 2015-09-08 DIAGNOSIS — Z8619 Personal history of other infectious and parasitic diseases: Secondary | ICD-10-CM | POA: Insufficient documentation

## 2015-09-08 DIAGNOSIS — Z8719 Personal history of other diseases of the digestive system: Secondary | ICD-10-CM | POA: Insufficient documentation

## 2015-09-08 DIAGNOSIS — N390 Urinary tract infection, site not specified: Secondary | ICD-10-CM

## 2015-09-08 LAB — URINALYSIS, ROUTINE W REFLEX MICROSCOPIC
BILIRUBIN URINE: NEGATIVE
GLUCOSE, UA: NEGATIVE mg/dL
Hgb urine dipstick: NEGATIVE
Ketones, ur: NEGATIVE mg/dL
Nitrite: NEGATIVE
PH: 7 (ref 5.0–8.0)
Protein, ur: NEGATIVE mg/dL
SPECIFIC GRAVITY, URINE: 1.026 (ref 1.005–1.030)

## 2015-09-08 LAB — URINE MICROSCOPIC-ADD ON: RBC / HPF: NONE SEEN RBC/hpf (ref 0–5)

## 2015-09-08 LAB — LIPASE, BLOOD: LIPASE: 30 U/L (ref 11–51)

## 2015-09-08 LAB — COMPREHENSIVE METABOLIC PANEL
ALBUMIN: 4.4 g/dL (ref 3.5–5.0)
ALK PHOS: 67 U/L (ref 38–126)
ALT: 16 U/L (ref 14–54)
ANION GAP: 6 (ref 5–15)
AST: 16 U/L (ref 15–41)
BUN: 9 mg/dL (ref 6–20)
CALCIUM: 9.3 mg/dL (ref 8.9–10.3)
CHLORIDE: 105 mmol/L (ref 101–111)
CO2: 28 mmol/L (ref 22–32)
Creatinine, Ser: 0.76 mg/dL (ref 0.44–1.00)
GFR calc Af Amer: >60 mL/min (ref >60)
GFR calc non Af Amer: >60 mL/min (ref >60)
GLUCOSE: 118 mg/dL — AB (ref 65–99)
Potassium: 3.4 mmol/L — ABNORMAL LOW (ref 3.5–5.1)
SODIUM: 139 mmol/L (ref 135–145)
Total Bilirubin: 0.8 mg/dL (ref 0.3–1.2)
Total Protein: 7.5 g/dL (ref 6.5–8.1)

## 2015-09-08 LAB — CBC
HCT: 37.4 % (ref 36.0–46.0)
HEMOGLOBIN: 13.3 g/dL (ref 12.0–15.0)
MCH: 27.9 pg (ref 26.0–34.0)
MCHC: 35.6 g/dL (ref 30.0–36.0)
MCV: 78.6 fL (ref 78.0–100.0)
Platelets: 271 10*3/uL (ref 150–400)
RBC: 4.76 MIL/uL (ref 3.87–5.11)
RDW: 13.3 % (ref 11.5–15.5)
WBC: 14.4 10*3/uL — ABNORMAL HIGH (ref 4.0–10.5)

## 2015-09-08 LAB — PREGNANCY, URINE: PREG TEST UR: NEGATIVE

## 2015-09-08 MED ORDER — ONDANSETRON 8 MG PO TBDP
8.0000 mg | ORAL_TABLET | Freq: Once | ORAL | Status: AC
  Administered 2015-09-08: 8 mg via ORAL
  Filled 2015-09-08: qty 1

## 2015-09-08 NOTE — ED Provider Notes (Signed)
CSN: [id number]     Arrival date & time 09/08/15  [id number] History  By signing my name below, I, Irene Pap, attest that this documentation has been prepared under the direction and in the presence of Shanon Rosser, MD. Electronically Signed: Irene Pap, ED Scribe. 09/08/2015. 11:22 PM.   Chief Complaint  Patient presents with  . Dysuria   The history is provided by the patient. No language interpreter was used.   HPI Comments: Julia Owens is a 29 y.o. female who presents to the Emergency Department complaining of "really bad" burning with urination, cramping lower abdominal pain and back pain onset one day ago. Pt reports associated nausea, urinary urgency, urinary frequency and voiding small amounts. She denies fever, chills, vomiting, vaginal bleeding or vaginal discharge. Pt has not tried anything for her symptoms.    Past Medical History  Diagnosis Date  . Abscessed tooth     current  . Vaginal Pap smear, abnormal   . Trichomonas vaginitis   . Gestational diabetes    Past Surgical History  Procedure Laterality Date  . Colposcopy    . Cesarean section N/A 09/25/2014    Procedure: CESAREAN SECTION;  Surgeon: Frederico Hamman, MD;  Location: [facility];  Service: Obstetrics;  Laterality: N/A;   Family History  Problem Relation Age of Onset  . Diabetes Father    Social History  Substance Use Topics  . Smoking status: Never Smoker   . Smokeless tobacco: Never Used  . Alcohol Use: Yes     Comment: socially drinks   OB History    Gravida Para Term Preterm AB TAB SAB Ectopic Multiple Living   3 1 1  0 2 0 2 0 0 1     Review of Systems 10 Systems reviewed and all are negative for acute change except as noted in the HPI.  Allergies  Review of patient's allergies indicates no known allergies.  Home Medications   Prior to Admission medications   Medication Sig Start Date End Date Taking? Authorizing Provider  acetaminophen (TYLENOL) 500 MG tablet Take 1,000 mg by  mouth every 6 (six) hours as needed for mild pain.   Yes Historical Provider, MD  Prenatal Vit-Fe Fumarate-FA (PRENATAL MULTIVITAMIN) TABS tablet Take 1 tablet by mouth daily at 12 noon.   Yes Historical Provider, MD  fluconazole (DIFLUCAN) 150 MG tablet Take 1 tablet (150 mg total) by mouth once. May repeat once if not improved Patient not taking: Reported on 09/08/2015 08/01/15   Tanna Savoy Stinson, DO   BP 137/92 mmHg  Pulse 87  Temp(Src) 98.2 F (36.8 C) (Oral)  Resp 18  Wt 180 lb (81.647 kg)  SpO2 97% Physical Exam General: Well-developed, well-nourished female in no acute distress; appearance consistent with age of record HENT: normocephalic; atraumatic Eyes: pupils equal, round and reactive to light; extraocular muscles intact Neck: supple Heart: regular rate and rhythm Lungs: clear to auscultation bilaterally Abdomen: soft; nondistended; suprapubic tenderness; no masses or hepatosplenomegaly; bowel sounds present Extremities: No deformity; full range of motion Neurologic: Awake, alert and oriented; motor function intact in all extremities and symmetric; no facial droop Skin: Warm and dry Psychiatric: Normal mood and affect  ED Course  Procedures (including critical care time)   MDM   Nursing notes and vitals signs, including pulse oximetry, reviewed.  Summary of this visit's results, reviewed by myself:  Labs:  Results for orders placed or performed during the hospital encounter of 09/08/15 (from the past 24 hour(s))  Lipase, blood  Status: None   Collection Time: 09/08/15  7:18 PM  Result Value Ref Range   Lipase 30 11 - 51 U/L  Comprehensive metabolic panel     Status: Abnormal   Collection Time: 09/08/15  7:18 PM  Result Value Ref Range   Sodium 139 135 - 145 mmol/L   Potassium 3.4 (L) 3.5 - 5.1 mmol/L   Chloride 105 101 - 111 mmol/L   CO2 28 22 - 32 mmol/L   Glucose, Bld 118 (H) 65 - 99 mg/dL   BUN 9 6 - 20 mg/dL   Creatinine, Ser 0.76 0.44 - 1.00 mg/dL    Calcium 9.3 8.9 - 10.3 mg/dL   Total Protein 7.5 6.5 - 8.1 g/dL   Albumin 4.4 3.5 - 5.0 g/dL   AST 16 15 - 41 U/L   ALT 16 14 - 54 U/L   Alkaline Phosphatase 67 38 - 126 U/L   Total Bilirubin 0.8 0.3 - 1.2 mg/dL   GFR calc non Af Amer >60 >60 mL/min   GFR calc Af Amer >60 >60 mL/min   Anion gap 6 5 - 15  CBC     Status: Abnormal   Collection Time: 09/08/15  7:18 PM  Result Value Ref Range   WBC 14.4 (H) 4.0 - 10.5 K/uL   RBC 4.76 3.87 - 5.11 MIL/uL   Hemoglobin 13.3 12.0 - 15.0 g/dL   HCT 37.4 36.0 - 46.0 %   MCV 78.6 78.0 - 100.0 fL   MCH 27.9 26.0 - 34.0 pg   MCHC 35.6 30.0 - 36.0 g/dL   RDW 13.3 11.5 - 15.5 %   Platelets 271 150 - 400 K/uL  Urinalysis, Routine w reflex microscopic (not at [facility])     Status: Abnormal   Collection Time: 09/08/15 11:13 PM  Result Value Ref Range   Color, Urine YELLOW YELLOW   APPearance TURBID (A) CLEAR   Specific Gravity, Urine 1.026 1.005 - 1.030   pH 7.0 5.0 - 8.0   Glucose, UA NEGATIVE NEGATIVE mg/dL   Hgb urine dipstick NEGATIVE NEGATIVE   Bilirubin Urine NEGATIVE NEGATIVE   Ketones, ur NEGATIVE NEGATIVE mg/dL   Protein, ur NEGATIVE NEGATIVE mg/dL   Nitrite NEGATIVE NEGATIVE   Leukocytes, UA SMALL (A) NEGATIVE  Pregnancy, urine     Status: None   Collection Time: 09/08/15 11:13 PM  Result Value Ref Range   Preg Test, Ur NEGATIVE NEGATIVE  Urine microscopic-add on     Status: Abnormal   Collection Time: 09/08/15 11:13 PM  Result Value Ref Range   Squamous Epithelial / LPF 6-30 (A) NONE SEEN   WBC, UA 6-30 0 - 5 WBC/hpf   RBC / HPF NONE SEEN 0 - 5 RBC/hpf   Bacteria, UA MANY (A) NONE SEEN    I personally performed the services described in this documentation, which was scribed in my presence. The recorded information has been reviewed and is accurate.   Shanon Rosser, MD 09/09/15 0000

## 2015-09-08 NOTE — ED Notes (Signed)
Pt presents with c/o burning with urination, abdominal pain, and some back pain. Pt reports she did feel nauseated yesterday, denies any vomiting. NAD.

## 2015-09-09 MED ORDER — PHENAZOPYRIDINE HCL 200 MG PO TABS
200.0000 mg | ORAL_TABLET | Freq: Three times a day (TID) | ORAL | Status: DC
Start: 2015-09-09 — End: 2016-03-11

## 2015-09-09 MED ORDER — PHENAZOPYRIDINE HCL 200 MG PO TABS
200.0000 mg | ORAL_TABLET | Freq: Three times a day (TID) | ORAL | Status: DC
  Administered 2015-09-09: 200 mg via ORAL
  Filled 2015-09-09: qty 1

## 2015-09-09 MED ORDER — NITROFURANTOIN MONOHYD MACRO 100 MG PO CAPS: 100.0000 mg | ORAL_CAPSULE | Freq: Two times a day (BID) | ORAL | Status: DC

## 2015-09-09 MED ORDER — ONDANSETRON HCL 8 MG PO TABS: 8.0000 mg | ORAL_TABLET | Freq: Three times a day (TID) | ORAL | Status: DC | PRN

## 2015-09-09 MED ORDER — NITROFURANTOIN MONOHYD MACRO 100 MG PO CAPS
100.0000 mg | ORAL_CAPSULE | Freq: Once | ORAL | Status: AC
  Administered 2015-09-09: 100 mg via ORAL
  Filled 2015-09-09: qty 1

## 2015-09-09 NOTE — ED Notes (Signed)
Patient was alert, oriented and stable upon discharge. RN went over AVS and patient had no further questions.  

## 2015-09-11 LAB — URINE CULTURE

## 2015-09-12 ENCOUNTER — Telehealth (HOSPITAL_COMMUNITY): Payer: Self-pay

## 2015-09-12 NOTE — Telephone Encounter (Signed)
Post ED Visit - Positive Culture Follow-up  Culture report reviewed by antimicrobial stewardship pharmacist:  [x]  Elenor Quinones, Pharm.D. []  Heide Guile, Pharm.D., BCPS []  Parks Neptune, Pharm.D. []  Alycia Rossetti, Pharm.D., BCPS []  [place], Pharm.D., BCPS, AAHIVP []  Legrand Como, Pharm.D., BCPS, AAHIVP []  Milus Glazier, Pharm.D. []  Stephens November, [place].D.  Positive urine culture, >/= 100,000 colonies -> E Coli Treated with Nitrofurantoin, organism sensitive to the same and no further patient follow-up is required at this time.  Dortha Kern 09/12/2015, 9:34 AM

## 2015-11-15 ENCOUNTER — Encounter (HOSPITAL_COMMUNITY): Payer: Self-pay | Admitting: Emergency Medicine

## 2015-11-15 ENCOUNTER — Emergency Department (HOSPITAL_COMMUNITY)
Admission: EM | Admit: 2015-11-15 | Discharge: 2015-11-15 | Disposition: A | Discharge status: Home / Self Care | Payer: Medicaid Other | Attending: Emergency Medicine | Admitting: Emergency Medicine

## 2015-11-15 ENCOUNTER — Emergency Department (HOSPITAL_COMMUNITY): Payer: Medicaid Other

## 2015-11-15 DIAGNOSIS — Y998 Other external cause status: Secondary | ICD-10-CM | POA: Insufficient documentation

## 2015-11-15 DIAGNOSIS — S0990XA Unspecified injury of head, initial encounter: Secondary | ICD-10-CM | POA: Insufficient documentation

## 2015-11-15 DIAGNOSIS — Y9289 Other specified places as the place of occurrence of the external cause: Secondary | ICD-10-CM | POA: Insufficient documentation

## 2015-11-15 DIAGNOSIS — Z79899 Other long term (current) drug therapy: Secondary | ICD-10-CM | POA: Insufficient documentation

## 2015-11-15 DIAGNOSIS — Z8619 Personal history of other infectious and parasitic diseases: Secondary | ICD-10-CM | POA: Insufficient documentation

## 2015-11-15 DIAGNOSIS — Y9389 Activity, other specified: Secondary | ICD-10-CM | POA: Insufficient documentation

## 2015-11-15 DIAGNOSIS — Z8632 Personal history of gestational diabetes: Secondary | ICD-10-CM | POA: Insufficient documentation

## 2015-11-15 MED ORDER — ONDANSETRON 8 MG PO TBDP: 8.0000 mg | ORAL_TABLET | Freq: Three times a day (TID) | ORAL | Status: DC | PRN

## 2015-11-15 MED ORDER — ONDANSETRON 8 MG PO TBDP
8.0000 mg | ORAL_TABLET | Freq: Once | ORAL | Status: AC
  Administered 2015-11-15: 8 mg via ORAL
  Filled 2015-11-15: qty 1

## 2015-11-15 MED ORDER — ACETAMINOPHEN 325 MG PO TABS
650.0000 mg | ORAL_TABLET | Freq: Once | ORAL | Status: AC
  Administered 2015-11-15: 650 mg via ORAL
  Filled 2015-11-15: qty 2
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
## 2015-11-15 NOTE — Discharge Instructions (Signed)
Take tylenol or motrin for headache. zofran as needed for nausea. Rest. Follow up as needed.    Concussion, Adult A concussion, or closed-head injury, is a brain injury caused by a direct blow to the head or by a quick and sudden movement (jolt) of the head or neck. Concussions are usually not life-threatening. Even so, the effects of a concussion can be serious. If you have had a concussion before, you are more likely to experience concussion-like symptoms after a direct blow to the head.  CAUSES  Direct blow to the head, such as from running into another player during a soccer game, being hit in a fight, or hitting your head on a hard surface.  A jolt of the head or neck that causes the brain to move back and forth inside the skull, such as in a car crash. SIGNS AND SYMPTOMS The signs of a concussion can be hard to notice. Early on, they may be missed by you, family members, and health care providers. You may look fine but act or feel differently. Symptoms are usually temporary, but they may last for days, weeks, or even longer. Some symptoms may appear right away while others may not show up for hours or days. Every head injury is different. Symptoms include:  Mild to moderate headaches that will not go away.  A feeling of pressure inside your head.  Having more trouble than usual:  Learning or remembering things you have heard.  Answering questions.  Paying attention or concentrating.  Organizing daily tasks.  Making decisions and solving problems.  Slowness in thinking, acting or reacting, speaking, or reading.  Getting lost or being easily confused.  Feeling tired all the time or lacking energy (fatigued).  Feeling drowsy.  Sleep disturbances.  Sleeping more than usual.  Sleeping less than usual.  Trouble falling asleep.  Trouble sleeping (insomnia).  Loss of balance or feeling lightheaded or dizzy.  Nausea or vomiting.  Numbness or tingling.  Increased  sensitivity to:  Sounds.  Lights.  Distractions.  Vision problems or eyes that tire easily.  Diminished sense of taste or smell.  Ringing in the ears.  Mood changes such as feeling sad or anxious.  Becoming easily irritated or angry for [place] or no reason.  Lack of motivation.  Seeing or hearing things other people do not see or hear (hallucinations). DIAGNOSIS Your health care provider can usually diagnose a concussion based on a description of your injury and symptoms. He or she will ask whether you passed out (lost consciousness) and whether you are having trouble remembering events that happened right before and during your injury. Your evaluation might include:  A brain scan to look for signs of injury to the brain. Even if the test shows no injury, you may still have a concussion.  Blood tests to be sure other problems are not present. TREATMENT  Concussions are usually treated in an emergency department, in urgent care, or at a clinic. You may need to stay in the hospital overnight for further treatment.  Tell your health care provider if you are taking any medicines, including prescription medicines, over-the-counter medicines, and natural remedies. Some medicines, such as blood thinners (anticoagulants) and aspirin, may increase the chance of complications. Also tell your health care provider whether you have had alcohol or are taking illegal drugs. This information may affect treatment.  Your health care provider will send you home with important instructions to follow.  How fast you will recover from a concussion  depends on many factors. These factors include how severe your concussion is, what part of your brain was injured, your age, and how healthy you were before the concussion.  Most people with mild injuries recover fully. Recovery can take time. In general, recovery is slower in older persons. Also, persons who have had a concussion in the past or have other  medical problems may find that it takes longer to recover from their current injury. HOME CARE INSTRUCTIONS General Instructions  Carefully follow the directions your health care provider gave you.  Only take over-the-counter or prescription medicines for pain, discomfort, or fever as directed by your health care provider.  Take only those medicines that your health care provider has approved.  Do not drink alcohol until your health care provider says you are well enough to do so. Alcohol and certain other drugs may slow your recovery and can put you at risk of further injury.  If it is harder than usual to remember things, write them down.  If you are easily distracted, try to do one thing at a time. For example, do not try to watch TV while fixing dinner.  Talk with family members or close friends when making important decisions.  Keep all follow-up appointments. Repeated evaluation of your symptoms is recommended for your recovery.  Watch your symptoms and tell others to do the same. Complications sometimes occur after a concussion. Older adults with a brain injury may have a higher risk of serious complications, such as a blood clot on the brain.  Tell your teachers, school nurse, school counselor, coach, athletic trainer, or work Freight forwarder about your injury, symptoms, and restrictions. Tell them about what you can or cannot do. They should watch for:  Increased problems with attention or concentration.  Increased difficulty remembering or learning new information.  Increased time needed to complete tasks or assignments.  Increased irritability or decreased ability to cope with stress.  Increased symptoms.  Rest. Rest helps the brain to heal. Make sure you:  Get plenty of sleep at night. Avoid staying up late at night.  Keep the same bedtime hours on weekends and weekdays.  Rest during the day. Take daytime naps or rest breaks when you feel tired.  Limit activities that  require a lot of thought or concentration. These include:  Doing homework or job-related work.  Watching TV.  Working on the computer.  Avoid any situation where there is potential for another head injury (football, hockey, soccer, basketball, martial arts, downhill snow sports and horseback riding). Your condition will get worse every time you experience a concussion. You should avoid these activities until you are evaluated by the appropriate follow-up health care providers. Returning To Your Regular Activities You will need to return to your normal activities slowly, not all at once. You must give your body and brain enough time for recovery.  Do not return to sports or other athletic activities until your health care provider tells you it is safe to do so.  Ask your health care provider when you can drive, ride a bicycle, or operate heavy machinery. Your ability to react may be slower after a brain injury. Never do these activities if you are dizzy.  Ask your health care provider about when you can return to work or school. Preventing Another Concussion It is very important to avoid another brain injury, especially before you have recovered. In rare cases, another injury can lead to permanent brain damage, brain swelling, or death. The risk  of this is greatest during the first 7-10 days after a head injury. Avoid injuries by:  Wearing a seat belt when riding in a car.  Drinking alcohol only in moderation.  Wearing a helmet when biking, skiing, skateboarding, skating, or doing similar activities.  Avoiding activities that could lead to a second concussion, such as contact or recreational sports, until your health care provider says it is okay.  Taking safety measures in your home.  Remove clutter and tripping hazards from floors and stairways.  Use grab bars in bathrooms and handrails by stairs.  Place non-slip mats on floors and in bathtubs.  Improve lighting in dim  areas. SEEK MEDICAL CARE IF:  You have increased problems paying attention or concentrating.  You have increased difficulty remembering or learning new information.  You need more time to complete tasks or assignments than before.  You have increased irritability or decreased ability to cope with stress.  You have more symptoms than before. Seek medical care if you have any of the following symptoms for more than 2 weeks after your injury:  Lasting (chronic) headaches.  Dizziness or balance problems.  Nausea.  Vision problems.  Increased sensitivity to noise or light.  Depression or mood swings.  Anxiety or irritability.  Memory problems.  Difficulty concentrating or paying attention.  Sleep problems.  Feeling tired all the time. SEEK IMMEDIATE MEDICAL CARE IF:  You have severe or worsening headaches. These may be a sign of a blood clot in the brain.  You have weakness (even if only in one hand, leg, or part of the face).  You have numbness.  You have decreased coordination.  You vomit repeatedly.  You have increased sleepiness.  One pupil is larger than the other.  You have convulsions.  You have slurred speech.  You have increased confusion. This may be a sign of a blood clot in the brain.  You have increased restlessness, agitation, or irritability.  You are unable to recognize people or places.  You have neck pain.  It is difficult to wake you up.  You have unusual behavior changes.  You lose consciousness. MAKE SURE YOU:  Understand these instructions.  Will watch your condition.  Will get help right away if you are not doing well or get worse.   This information is not intended to replace advice given to you by your health care provider. Make sure you discuss any questions you have with your health care provider.   Document Released: 12/04/2003 Document Revised: 10/04/2014 Document Reviewed: 04/05/2013 Elsevier Interactive Patient  Education [employer].
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
## 2015-12-06 ENCOUNTER — Encounter (HOSPITAL_COMMUNITY): Payer: Self-pay | Admitting: Emergency Medicine

## 2015-12-06 ENCOUNTER — Emergency Department (HOSPITAL_COMMUNITY)
Admission: EM | Admit: 2015-12-06 | Discharge: 2015-12-06 | Disposition: A | Discharge status: Home / Self Care | Payer: Medicaid Other | Attending: Emergency Medicine | Admitting: Emergency Medicine

## 2015-12-06 DIAGNOSIS — T373X5A Adverse effect of other antiprotozoal drugs, initial encounter: Secondary | ICD-10-CM | POA: Insufficient documentation

## 2015-12-06 DIAGNOSIS — X58XXXA Exposure to other specified factors, initial encounter: Secondary | ICD-10-CM | POA: Insufficient documentation

## 2015-12-06 DIAGNOSIS — Y998 Other external cause status: Secondary | ICD-10-CM | POA: Insufficient documentation

## 2015-12-06 DIAGNOSIS — Z8619 Personal history of other infectious and parasitic diseases: Secondary | ICD-10-CM | POA: Insufficient documentation

## 2015-12-06 DIAGNOSIS — T7840XA Allergy, unspecified, initial encounter: Secondary | ICD-10-CM

## 2015-12-06 DIAGNOSIS — Z8719 Personal history of other diseases of the digestive system: Secondary | ICD-10-CM | POA: Insufficient documentation

## 2015-12-06 DIAGNOSIS — Z8742 Personal history of other diseases of the female genital tract: Secondary | ICD-10-CM | POA: Insufficient documentation

## 2015-12-06 DIAGNOSIS — Z8632 Personal history of gestational diabetes: Secondary | ICD-10-CM | POA: Insufficient documentation

## 2015-12-06 DIAGNOSIS — L270 Generalized skin eruption due to drugs and medicaments taken internally: Secondary | ICD-10-CM | POA: Insufficient documentation

## 2015-12-06 DIAGNOSIS — Y9389 Activity, other specified: Secondary | ICD-10-CM | POA: Insufficient documentation

## 2015-12-06 DIAGNOSIS — Y9289 Other specified places as the place of occurrence of the external cause: Secondary | ICD-10-CM | POA: Insufficient documentation

## 2015-12-06 MED ORDER — LORATADINE 10 MG PO TABS
10.0000 mg | ORAL_TABLET | Freq: Once | ORAL | Status: AC
  Administered 2015-12-06: 10 mg via ORAL
  Filled 2015-12-06: qty 1

## 2015-12-06 MED ORDER — LORATADINE 10 MG PO TABS: 10.0000 mg | ORAL_TABLET | Freq: Every day | ORAL | Status: DC

## 2015-12-06 MED ORDER — CLINDAMYCIN HCL 150 MG PO CAPS: 300.0000 mg | ORAL_CAPSULE | Freq: Two times a day (BID) | ORAL | Status: DC

## 2015-12-06 MED ORDER — DIPHENHYDRAMINE HCL 25 MG PO TABS: 25.0000 mg | ORAL_TABLET | Freq: Four times a day (QID) | ORAL | Status: DC

## 2015-12-06 NOTE — Discharge Instructions (Signed)
1. Medications: clindamycin for BV, claritin for itching during day, benadryl for itching before bed, usual home medications 2. Treatment: rest, drink plenty of fluids 3. Follow Up: please followup with your primary doctor for discussion of your diagnoses and further evaluation after today's visit; please return to the ER for facial swelling, difficulty breathing, new or worsening symptoms   Allergies An allergy is when your body reacts to a substance in a way that is not normal. An allergic reaction can happen after you:  Eat something.  Breathe in something.  Touch something. WHAT KINDS OF ALLERGIES ARE THERE? You can be allergic to:  Things that are only around during certain seasons, like molds and pollens.  Foods.  Drugs.  Insects.  Animal dander. WHAT ARE SYMPTOMS OF ALLERGIES?  Puffiness (swelling). This may happen on the lips, face, tongue, mouth, or throat.  Sneezing.  Coughing.  Breathing loudly (wheezing).  Stuffy nose.  Tingling in the mouth.  A rash.  Itching.  Itchy, red, puffy areas of skin (hives).  Watery eyes.  Throwing up (vomiting).  Watery poop (diarrhea).  Dizziness.  Feeling faint or fainting.  Trouble breathing or swallowing.  A tight feeling in the chest.  A fast heartbeat. HOW ARE ALLERGIES DIAGNOSED? Allergies can be diagnosed with:  A medical and family history.  Skin tests.  Blood tests.  A food diary. A food diary is a record of all the foods, drinks, and symptoms you have each day.  The results of an elimination diet. This diet involves making sure not to eat certain foods and then seeing what happens when you start eating them again. HOW ARE ALLERGIES TREATED? There is no cure for allergies, but allergic reactions can be treated with medicine. Severe reactions usually need to be treated at a hospital.  HOW CAN REACTIONS BE PREVENTED? The best way to prevent an allergic reaction is to avoid the thing you are  allergic to. Allergy shots and medicines can also help prevent reactions in some cases.   This information is not intended to replace advice given to you by your health care provider. Make sure you discuss any questions you have with your health care provider.   Document Released: 01/08/2013 Document Revised: 10/04/2014 Document Reviewed: 06/25/2014 Elsevier Interactive Patient Education [employer].

## 2015-12-06 NOTE — ED Provider Notes (Signed)
CSN: [id number]     Arrival date & time 12/06/15  1411 History  By signing my name below, I, Julia Owens, attest that this documentation has been prepared under the direction and in the presence of Bernerd Limbo, [place]. Electronically Signed: Jolayne Owens, Scribe. 12/06/2015. 3:27 PM.   Chief Complaint  Patient presents with  . Urticaria    The history is provided by the patient. No language interpreter was used.     HPI Comments: Julia Owens is a 30 y.o. female with a PMH of bacterial vaginosis who presents to the Emergency Department complaining of sudden onset of an itching, erythematous rash to her chest and abdomen, which she noticed about 3.5 hours ago, after running errands this morning. She notes that she was prescribed flagyl yesterday after being diagnosed with bacterial vaginosis, but states that instead of receiving white pills like in the past, she received blue pills. She took one pill last night and one this morning. She denies facial swelling, difficulty breathing, trouble swallowing, use of new lotions, soaps, or detergents, consumption of any new foods, and hx of similar symptoms.    Past Medical History  Diagnosis Date  . Abscessed tooth     current  . Vaginal Pap smear, abnormal   . Trichomonas vaginitis   . Gestational diabetes   . Gestational diabetes    Past Surgical History  Procedure Laterality Date  . Colposcopy    . Cesarean section N/A 09/25/2014    Procedure: CESAREAN SECTION;  Surgeon: Frederico Hamman, MD;  Location: [facility];  Service: Obstetrics;  Laterality: N/A;   Family History  Problem Relation Age of Onset  . Diabetes Father    Social History  Substance Use Topics  . Smoking status: Never Smoker   . Smokeless tobacco: Never Used  . Alcohol Use: Yes     Comment: socially drinks   OB History    Gravida Para Term Preterm AB TAB SAB Ectopic Multiple Living   3 1 1  0 2 0 2 0 0 1      Review of Systems  HENT:  Negative for trouble swallowing.   Respiratory: Negative for shortness of breath.   Skin: Positive for rash.  Allergic/Immunologic:       Allergic reaction to flagyl    Allergies  Review of patient's allergies indicates no known allergies.  Home Medications   Prior to Admission medications   Medication Sig Start Date End Date Taking? Authorizing Provider  clindamycin (CLEOCIN) 150 MG capsule Take 2 capsules (300 mg total) by mouth 2 (two) times daily. 12/06/15   Marella Chimes, PA-C  diphenhydrAMINE (BENADRYL) 25 MG tablet Take 1 tablet (25 mg total) by mouth every 6 (six) hours. 12/06/15   Marella Chimes, PA-C  loratadine (CLARITIN) 10 MG tablet Take 1 tablet (10 mg total) by mouth daily. 12/06/15   Marella Chimes, PA-C  nitrofurantoin, macrocrystal-monohydrate, (MACROBID) 100 MG capsule Take 1 capsule (100 mg total) by mouth 2 (two) times daily. X 7 days Patient not taking: Reported on 11/15/2015 09/09/15   Shanon Rosser, MD  ondansetron (ZOFRAN ODT) 8 MG disintegrating tablet Take 1 tablet (8 mg total) by mouth every 8 (eight) hours as needed for nausea or vomiting. 11/15/15   Tatyana Kirichenko, PA-C  ondansetron (ZOFRAN) 8 MG tablet Take 1 tablet (8 mg total) by mouth every 8 (eight) hours as needed for nausea or vomiting. Patient not taking: Reported on 11/15/2015 09/09/15   Shanon Rosser, MD  phenazopyridine (PYRIDIUM) 200 MG tablet Take 1 tablet (200 mg total) by mouth 3 (three) times daily. Patient not taking: Reported on 11/15/2015 09/09/15   Shanon Rosser, MD    BP 127/96 mmHg  Pulse 77  Temp(Src) 97.7 F (36.5 C) (Oral)  Resp 18  SpO2 100%  LMP 10/17/2015 Physical Exam  Constitutional: She is oriented to person, place, and time. She appears well-developed and well-nourished. No distress.  HENT:  Head: Normocephalic and atraumatic.  Right Ear: External ear normal.  Left Ear: External ear normal.  Nose: Nose normal.  Mouth/Throat: Oropharynx is clear and moist.  No oropharyngeal exudate.  Eyes: Conjunctivae and EOM are normal. Pupils are equal, round, and reactive to light. Right eye exhibits no discharge. Left eye exhibits no discharge. No scleral icterus.  Neck: Normal range of motion. Neck supple.  Cardiovascular: Normal rate, regular rhythm, normal heart sounds and intact distal pulses.   Pulmonary/Chest: Effort normal and breath sounds normal. No respiratory distress. She has no wheezes. She has no rales.  Abdominal: Soft. She exhibits no distension and no mass. There is no tenderness. There is no rebound and no guarding.  Musculoskeletal: Normal range of motion. She exhibits no edema or tenderness.  Neurological: She is alert and oriented to person, place, and time.  Skin: Skin is warm and dry. Rash noted. She is not diaphoretic. There is erythema.  Multiple erythematous macules to chest and abdomen. No discharge. No skin sloughing. No mucous membrane involvement.  Psychiatric: She has a normal mood and affect. Her behavior is normal.  Nursing note and vitals reviewed.   ED Course  Procedures   DIAGNOSTIC STUDIES: Oxygen Saturation is 100% on RA, normal by my interpretation.  COORDINATION OF CARE: 3:07 PM Will administer medication in the ED and will prescribe benadryl. Discussed treatment plan with pt at bedside and pt agreed to plan.   MDM   Final diagnoses:  Allergic reaction, initial encounter    30 year old female presents with rash. States she was prescribed flagyl by her PCP at her annual physical yesterday, and notes this preceded her symptoms. Denies facial swelling, difficulty breathing, trouble swallowing, new exposures. Patient is afebrile. Vital signs stable. On exam, patient has multiple erythematous macules to chest and abdomen. No discharge. No skin sloughing. No mucous membrane involvement. Lungs clear to auscultation bilaterally. No increased work of breathing or respiratory distress. Will give loratadine in the ED given  patient states benadryl makes her sleepy and she drove herself today. Spoke with pharmacist, who advised symptoms may be due to delayed reaction to antibiotic or inactive ingredient (patient initially noted she had taken flagyl in the past without allergic reaction, but after further discussion, she states she has taken clindamycin). Patient is non-toxic and well-appearing, feel she is stable for discharge at this time. Will give benadryl for home to help with itching. Will change flagyl to clindamycin. Patient to follow-up with PCP. Strict return precautions discussed. Patient verbalizes her understanding and is in agreement with plan.  BP 127/96 mmHg  Pulse 77  Temp(Src) 97.7 F (36.5 C) (Oral)  Resp 18  SpO2 100%  LMP 10/17/2015  I personally performed the services described in this documentation, which was scribed in my presence. The recorded information has been reviewed and is accurate.     Marella Chimes, PA-C 12/06/15 1538  Tanna Furry, MD 12/10/15 2259

## 2015-12-06 NOTE — ED Notes (Signed)
Pt states she started Flagyl yesterday for dx of bacterial vaginosis. She took her first dose last night and had another one this morning. States now she has hives on her chest and abdomen. States areas are very itchy. She denies difficulty breathing or tongue swelling. She has not taken any Benadryl. She is alert, no acute distress.

## 2015-12-08 ENCOUNTER — Emergency Department (HOSPITAL_COMMUNITY): Payer: No Typology Code available for payment source

## 2015-12-08 ENCOUNTER — Emergency Department (HOSPITAL_COMMUNITY)
Admission: EM | Admit: 2015-12-08 | Discharge: 2015-12-08 | Disposition: A | Discharge status: Home / Self Care | Payer: No Typology Code available for payment source | Attending: Emergency Medicine | Admitting: Emergency Medicine

## 2015-12-08 ENCOUNTER — Encounter (HOSPITAL_COMMUNITY): Payer: Self-pay | Admitting: Emergency Medicine

## 2015-12-08 DIAGNOSIS — R51 Headache: Secondary | ICD-10-CM

## 2015-12-08 DIAGNOSIS — S0990XA Unspecified injury of head, initial encounter: Secondary | ICD-10-CM | POA: Diagnosis not present

## 2015-12-08 DIAGNOSIS — Z8632 Personal history of gestational diabetes: Secondary | ICD-10-CM | POA: Insufficient documentation

## 2015-12-08 DIAGNOSIS — S00531A Contusion of lip, initial encounter: Secondary | ICD-10-CM | POA: Insufficient documentation

## 2015-12-08 DIAGNOSIS — Y998 Other external cause status: Secondary | ICD-10-CM | POA: Diagnosis not present

## 2015-12-08 DIAGNOSIS — Z792 Long term (current) use of antibiotics: Secondary | ICD-10-CM | POA: Insufficient documentation

## 2015-12-08 DIAGNOSIS — S4992XA Unspecified injury of left shoulder and upper arm, initial encounter: Secondary | ICD-10-CM | POA: Insufficient documentation

## 2015-12-08 DIAGNOSIS — Y9389 Activity, other specified: Secondary | ICD-10-CM | POA: Diagnosis not present

## 2015-12-08 DIAGNOSIS — Z79899 Other long term (current) drug therapy: Secondary | ICD-10-CM | POA: Insufficient documentation

## 2015-12-08 DIAGNOSIS — Y92410 Unspecified street and highway as the place of occurrence of the external cause: Secondary | ICD-10-CM | POA: Diagnosis not present

## 2015-12-08 DIAGNOSIS — Z8619 Personal history of other infectious and parasitic diseases: Secondary | ICD-10-CM | POA: Insufficient documentation

## 2015-12-08 DIAGNOSIS — R519 Headache, unspecified: Secondary | ICD-10-CM

## 2015-12-08 DIAGNOSIS — S0993XA Unspecified injury of face, initial encounter: Secondary | ICD-10-CM | POA: Diagnosis present

## 2015-12-08 MED ORDER — IBUPROFEN 800 MG PO TABS
800.0000 mg | ORAL_TABLET | Freq: Once | ORAL | Status: AC
  Administered 2015-12-08: 800 mg via ORAL
  Filled 2015-12-08: qty 1

## 2015-12-08 MED ORDER — ACETAMINOPHEN 500 MG PO TABS
1000.0000 mg | ORAL_TABLET | Freq: Once | ORAL | Status: AC
  Administered 2015-12-08: 1000 mg via ORAL
  Filled 2015-12-08: qty 2

## 2015-12-08 MED ORDER — OXYCODONE-ACETAMINOPHEN 5-325 MG PO TABS
1.0000 | ORAL_TABLET | Freq: Once | ORAL | Status: AC
  Administered 2015-12-08: 1 via ORAL
  Filled 2015-12-08: qty 1

## 2015-12-08 MED ORDER — METHOCARBAMOL 500 MG PO TABS: 500.0000 mg | ORAL_TABLET | Freq: Two times a day (BID) | ORAL | Status: DC

## 2015-12-08 MED ORDER — NAPROXEN 500 MG PO TABS: 500.0000 mg | ORAL_TABLET | Freq: Two times a day (BID) | ORAL | Status: DC
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
## 2015-12-08 NOTE — Discharge Instructions (Signed)
1. Medications: robaxin, naproxyn, usual home medications 2. Treatment: rest, drink plenty of fluids, gentle stretching as discussed, alternate ice and heat 3. Follow Up: Please followup with your primary doctor in 3 days for discussion of your diagnoses and further evaluation after today's visit; if you do not have a primary care doctor use the resource guide provided to find one;  Return to the ER for worsening back pain, difficulty walking, loss of bowel or bladder control or other concerning symptoms   Back Exercises The following exercises strengthen the muscles that help to support the back. They also help to keep the lower back flexible. Doing these exercises can help to prevent back pain or lessen existing pain. If you have back pain or discomfort, try doing these exercises 2-3 times each day or as told by your health care provider. When the pain goes away, do them once each day, but increase the number of times that you repeat the steps for each exercise (do more repetitions). If you do not have back pain or discomfort, do these exercises once each day or as told by your health care provider. EXERCISES Single Knee to Chest Repeat these steps 3-5 times for each leg: 1. Lie on your back on a firm bed or the floor with your legs extended. 2. Bring one knee to your chest. Your other leg should stay extended and in contact with the floor. 3. Hold your knee in place by grabbing your knee or thigh. 4. Pull on your knee until you feel a gentle stretch in your lower back. 5. Hold the stretch for 10-30 seconds. 6. Slowly release and straighten your leg. Pelvic Tilt Repeat these steps 5-10 times: 1. Lie on your back on a firm bed or the floor with your legs extended. 2. Bend your knees so they are pointing toward the ceiling and your feet are flat on the floor. 3. Tighten your lower abdominal muscles to press your lower back against the floor. This motion will tilt your pelvis so your tailbone  points up toward the ceiling instead of pointing to your feet or the floor. 4. With gentle tension and even breathing, hold this position for 5-10 seconds. Cat-Cow Repeat these steps until your lower back becomes more flexible: 1. Get into a hands-and-knees position on a firm surface. Keep your hands under your shoulders, and keep your knees under your hips. You may place padding under your knees for comfort. 2. Let your head hang down, and point your tailbone toward the floor so your lower back becomes rounded like the back of a cat. 3. Hold this position for 5 seconds. 4. Slowly lift your head and point your tailbone up toward the ceiling so your back forms a sagging arch like the back of a cow. 5. Hold this position for 5 seconds. Press-Ups Repeat these steps 5-10 times: 1. Lie on your abdomen (face-down) on the floor. 2. Place your palms near your head, about shoulder-width apart. 3. While you keep your back as relaxed as possible and keep your hips on the floor, slowly straighten your arms to raise the top half of your body and lift your shoulders. Do not use your back muscles to raise your upper torso. You may adjust the placement of your hands to make yourself more comfortable. 4. Hold this position for 5 seconds while you keep your back relaxed. 5. Slowly return to lying flat on the floor. Bridges Repeat these steps 10 times: 1. Lie on your back on  a firm surface. 2. Bend your knees so they are pointing toward the ceiling and your feet are flat on the floor. 3. Tighten your buttocks muscles and lift your buttocks off of the floor until your waist is at almost the same height as your knees. You should feel the muscles working in your buttocks and the back of your thighs. If you do not feel these muscles, slide your feet 1-2 inches farther away from your buttocks. 4. Hold this position for 3-5 seconds. 5. Slowly lower your hips to the starting position, and allow your buttocks muscles to  relax completely. If this exercise is too easy, try doing it with your arms crossed over your chest. Abdominal Crunches Repeat these steps 5-10 times: 1. Lie on your back on a firm bed or the floor with your legs extended. 2. Bend your knees so they are pointing toward the ceiling and your feet are flat on the floor. 3. Cross your arms over your chest. 4. Tip your chin slightly toward your chest without bending your neck. 5. Tighten your abdominal muscles and slowly raise your trunk (torso) high enough to lift your shoulder blades a tiny bit off of the floor. Avoid raising your torso higher than that, because it can put too much stress on your low back and it does not help to strengthen your abdominal muscles. 6. Slowly return to your starting position. Back Lifts Repeat these steps 5-10 times: 1. Lie on your abdomen (face-down) with your arms at your sides, and rest your forehead on the floor. 2. Tighten the muscles in your legs and your buttocks. 3. Slowly lift your chest off of the floor while you keep your hips pressed to the floor. Keep the back of your head in line with the curve in your back. Your eyes should be looking at the floor. 4. Hold this position for 3-5 seconds. 5. Slowly return to your starting position. SEEK MEDICAL CARE IF:  Your back pain or discomfort gets much worse when you do an exercise.  Your back pain or discomfort does not lessen within 2 hours after you exercise. If you have any of these problems, stop doing these exercises right away. Do not do them again unless your health care provider says that you can. SEEK IMMEDIATE MEDICAL CARE IF:  You develop sudden, severe back pain. If this happens, stop doing the exercises right away. Do not do them again unless your health care provider says that you can.   This information is not intended to replace advice given to you by your health care provider. Make sure you discuss any questions you have with your health  care provider.   Document Released: 10/21/2004 Document Revised: 06/04/2015 Document Reviewed: 11/07/2014 Elsevier Interactive Patient Education [employer].
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
## 2016-03-11 ENCOUNTER — Encounter (HOSPITAL_COMMUNITY): Payer: Self-pay | Admitting: Emergency Medicine

## 2016-03-11 ENCOUNTER — Emergency Department (HOSPITAL_COMMUNITY)
Admission: EM | Admit: 2016-03-11 | Discharge: 2016-03-11 | Disposition: A | Discharge status: Home / Self Care | Payer: No Typology Code available for payment source | Attending: Emergency Medicine | Admitting: Emergency Medicine

## 2016-03-11 DIAGNOSIS — M545 Low back pain, unspecified: Secondary | ICD-10-CM

## 2016-03-11 DIAGNOSIS — N343 Urethral syndrome, unspecified: Secondary | ICD-10-CM | POA: Insufficient documentation

## 2016-03-11 LAB — URINALYSIS, ROUTINE W REFLEX MICROSCOPIC
BILIRUBIN URINE: NEGATIVE
Glucose, UA: NEGATIVE mg/dL
HGB URINE DIPSTICK: NEGATIVE
Ketones, ur: NEGATIVE mg/dL
Leukocytes, UA: NEGATIVE
NITRITE: NEGATIVE
PROTEIN: NEGATIVE mg/dL
SPECIFIC GRAVITY, URINE: 1.034 — AB (ref 1.005–1.030)
pH: 6 (ref 5.0–8.0)

## 2016-03-11 LAB — POC URINE PREG, ED: PREG TEST UR: NEGATIVE

## 2016-03-11 MED ORDER — DOXYCYCLINE HYCLATE 100 MG PO TABS
100.0000 mg | ORAL_TABLET | Freq: Once | ORAL | Status: AC
  Administered 2016-03-11: 100 mg via ORAL
  Filled 2016-03-11: qty 1

## 2016-03-11 MED ORDER — OXYCODONE-ACETAMINOPHEN 5-325 MG PO TABS: 1.0000 | ORAL_TABLET | ORAL | Status: DC | PRN

## 2016-03-11 MED ORDER — OXYCODONE-ACETAMINOPHEN 5-325 MG PO TABS
1.0000 | ORAL_TABLET | Freq: Once | ORAL | Status: AC
  Administered 2016-03-11: 1 via ORAL
  Filled 2016-03-11: qty 1

## 2016-03-11 MED ORDER — IBUPROFEN 800 MG PO TABS
800.0000 mg | ORAL_TABLET | Freq: Once | ORAL | Status: AC
  Administered 2016-03-11: 800 mg via ORAL
  Filled 2016-03-11: qty 1

## 2016-03-11 MED ORDER — CYCLOBENZAPRINE HCL 10 MG PO TABS: 10.0000 mg | ORAL_TABLET | Freq: Three times a day (TID) | ORAL | Status: DC | PRN

## 2016-03-11 MED ORDER — DOXYCYCLINE HYCLATE 100 MG PO CAPS: 100.0000 mg | ORAL_CAPSULE | Freq: Two times a day (BID) | ORAL | Status: DC

## 2016-03-11 MED ORDER — CYCLOBENZAPRINE HCL 10 MG PO TABS
10.0000 mg | ORAL_TABLET | Freq: Once | ORAL | Status: AC
  Administered 2016-03-11: 10 mg via ORAL
  Filled 2016-03-11: qty 1

## 2016-03-11 NOTE — ED Notes (Signed)
Pt states she has low back pain, lower abd cramping, and burning with urination  Pt states her sxs started on the 14th
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
## 2016-03-11 NOTE — Discharge Instructions (Signed)
Take two naproxen tablets at a time, twice a day.   Back Pain, Adult Back pain is very common in adults.The cause of back pain is rarely dangerous and the pain often gets better over time.The cause of your back pain may not be known. Some common causes of back pain include:  Strain of the muscles or ligaments supporting the spine.  Wear and tear (degeneration) of the spinal disks.  Arthritis.  Direct injury to the back. For many people, back pain may return. Since back pain is rarely dangerous, most people can learn to manage this condition on their own. HOME CARE INSTRUCTIONS Watch your back pain for any changes. The following actions may help to lessen any discomfort you are feeling:  Remain active. It is stressful on your back to sit or stand in one place for long periods of time. Do not sit, drive, or stand in one place for more than 30 minutes at a time. Take short walks on even surfaces as soon as you are able.Try to increase the length of time you walk each day.  Exercise regularly as directed by your health care provider. Exercise helps your back heal faster. It also helps avoid future injury by keeping your muscles strong and flexible.  Do not stay in bed.Resting more than 1-2 days can delay your recovery.  Pay attention to your body when you bend and lift. The most comfortable positions are those that put less stress on your recovering back. Always use proper lifting techniques, including:  Bending your knees.  Keeping the load close to your body.  Avoiding twisting.  Find a comfortable position to sleep. Use a firm mattress and lie on your side with your knees slightly bent. If you lie on your back, put a pillow under your knees.  Avoid feeling anxious or stressed.Stress increases muscle tension and can worsen back pain.It is important to recognize when you are anxious or stressed and learn ways to manage it, such as with exercise.  Take medicines only as directed  by your health care provider. Over-the-counter medicines to reduce pain and inflammation are often the most helpful.Your health care provider may prescribe muscle relaxant drugs.These medicines help dull your pain so you can more quickly return to your normal activities and healthy exercise.  Apply ice to the injured area:  Put ice in a plastic bag.  Place a towel between your skin and the bag.  Leave the ice on for 20 minutes, 2-3 times a day for the first 2-3 days. After that, ice and heat may be alternated to reduce pain and spasms.  Maintain a healthy weight. Excess weight puts extra stress on your back and makes it difficult to maintain good posture. SEEK MEDICAL CARE IF:  You have pain that is not relieved with rest or medicine.  You have increasing pain going down into the legs or buttocks.  You have pain that does not improve in one week.  You have night pain.  You lose weight.  You have a fever or chills. SEEK IMMEDIATE MEDICAL CARE IF:   You develop new bowel or bladder control problems.  You have unusual weakness or numbness in your arms or legs.  You develop nausea or vomiting.  You develop abdominal pain.  You feel faint.   This information is not intended to replace advice given to you by your health care provider. Make sure you discuss any questions you have with your health care provider.   Document Released: 09/13/2005  Document Revised: 10/04/2014 Document Reviewed: 01/15/2014 Elsevier Interactive Patient Education 2016 [employer].  Cyclobenzaprine tablets What is this medicine? CYCLOBENZAPRINE (sye kloe BEN za preen) is a muscle relaxer. It is used to treat muscle pain, spasms, and stiffness. This medicine may be used for other purposes; ask your health care provider or pharmacist if you have questions. What should I tell my health care provider before I take this medicine? They need to know if you have any of these conditions: -heart disease,  irregular heartbeat, or previous heart attack -liver disease -thyroid problem -an unusual or allergic reaction to cyclobenzaprine, tricyclic antidepressants, lactose, other medicines, foods, dyes, or preservatives -pregnant or trying to get pregnant -breast-feeding How should I use this medicine? Take this medicine by mouth with a glass of water. Follow the directions on the prescription label. If this medicine upsets your stomach, take it with food or milk. Take your medicine at regular intervals. Do not take it more often than directed. Talk to your pediatrician regarding the use of this medicine in children. Special care may be needed. Overdosage: If you think you have taken too much of this medicine contact a poison control center or emergency room at once. NOTE: This medicine is only for you. Do not share this medicine with others. What if I miss a dose? If you miss a dose, take it as soon as you can. If it is almost time for your next dose, take only that dose. Do not take double or extra doses. What may interact with this medicine? Do not take this medicine with any of the following medications: -certain medicines for fungal infections like fluconazole, itraconazole, ketoconazole, posaconazole, voriconazole -cisapride -dofetilide -dronedarone -droperidol -flecainide -grepafloxacin -halofantrine -levomethadyl -MAOIs like Carbex, Eldepryl, Marplan, Nardil, and Parnate -nilotinib -pimozide -probucol -sertindole -thioridazine -ziprasidone This medicine may also interact with the following medications: -abarelix -alcohol -certain medicines for cancer -certain medicines for depression, anxiety, or psychotic disturbances -certain medicines for infection like alfuzosin, chloroquine, clarithromycin, levofloxacin, mefloquine, pentamidine, troleandomycin -certain medicines for an irregular heart beat -certain medicines used for sleep or numbness during surgery or  procedure -contrast dyes -dolasetron -guanethidine -methadone -octreotide -ondansetron -other medicines that prolong the QT interval (cause an abnormal heart rhythm) -palonosetron -phenothiazines like chlorpromazine, mesoridazine, prochlorperazine, thioridazine -tramadol -vardenafil This list may not describe all possible interactions. Give your health care provider a list of all the medicines, herbs, non-prescription drugs, or dietary supplements you use. Also tell them if you smoke, drink alcohol, or use illegal drugs. Some items may interact with your medicine. What should I watch for while using this medicine? Check with your doctor or health care professional if your condition does not improve within 1 to 3 weeks. You may get drowsy or dizzy when you first start taking the medicine or change doses. Do not drive, use machinery, or do anything that may be dangerous until you know how the medicine affects you. Stand or sit up slowly. Your mouth may get dry. Drinking water, chewing sugarless gum, or sucking on hard candy may help. What side effects may I notice from receiving this medicine? Side effects that you should report to your doctor or health care professional as soon as possible: -allergic reactions like skin rash, itching or hives, swelling of the face, lips, or tongue -chest pain -fast heartbeat -hallucinations -seizures -vomiting Side effects that usually do not require medical attention (report to your doctor or health care professional if they continue or are bothersome): -headache This list may not  describe all possible side effects. Call your doctor for medical advice about side effects. You may report side effects to FDA at 1-800-FDA-1088. Where should I keep my medicine? Keep out of the reach of children. Store at room temperature between 15 and 30 degrees C (59 and 86 degrees F). Keep container tightly closed. Throw away any unused medicine after the expiration  date. NOTE: This sheet is a summary. It may not cover all possible information. If you have questions about this medicine, talk to your doctor, pharmacist, or health care provider.    2016, Elsevier/Gold Standard. (2013-04-10 12:48:19)  Doxycycline tablets or capsules What is this medicine? DOXYCYCLINE (dox i SYE kleen) is a tetracycline antibiotic. It kills certain bacteria or stops their growth. It is used to treat many kinds of infections, like dental, skin, respiratory, and urinary tract infections. It also treats acne, Lyme disease, malaria, and certain sexually transmitted infections. This medicine may be used for other purposes; ask your health care provider or pharmacist if you have questions. What should I tell my health care provider before I take this medicine? They need to know if you have any of these conditions: -liver disease -long exposure to sunlight like working outdoors -stomach problems like colitis -an unusual or allergic reaction to doxycycline, tetracycline antibiotics, other medicines, foods, dyes, or preservatives -pregnant or trying to get pregnant -breast-feeding How should I use this medicine? Take this medicine by mouth with a full glass of water. Follow the directions on the prescription label. It is best to take this medicine without food, but if it upsets your stomach take it with food. Take your medicine at regular intervals. Do not take your medicine more often than directed. Take all of your medicine as directed even if you think you are better. Do not skip doses or stop your medicine early. Talk to your pediatrician regarding the use of this medicine in children. While this drug may be prescribed for selected conditions, precautions do apply. Overdosage: If you think you have taken too much of this medicine contact a poison control center or emergency room at once. NOTE: This medicine is only for you. Do not share this medicine with others. What if I miss a  dose? If you miss a dose, take it as soon as you can. If it is almost time for your next dose, take only that dose. Do not take double or extra doses. What may interact with this medicine? -antacids -barbiturates -birth control pills -bismuth subsalicylate -carbamazepine -methoxyflurane -other antibiotics -phenytoin -vitamins that contain iron -warfarin This list may not describe all possible interactions. Give your health care provider a list of all the medicines, herbs, non-prescription drugs, or dietary supplements you use. Also tell them if you smoke, drink alcohol, or use illegal drugs. Some items may interact with your medicine. What should I watch for while using this medicine? Tell your doctor or health care professional if your symptoms do not improve. Do not treat diarrhea with over the counter products. Contact your doctor if you have diarrhea that lasts more than 2 days or if it is severe and watery. Do not take this medicine just before going to bed. It may not dissolve properly when you lay down and can cause pain in your throat. Drink plenty of fluids while taking this medicine to also help reduce irritation in your throat. This medicine can make you more sensitive to the sun. Keep out of the sun. If you cannot avoid being in the sun,  wear protective clothing and use sunscreen. Do not use sun lamps or tanning beds/booths. Birth control pills may not work properly while you are taking this medicine. Talk to your doctor about using an extra method of birth control. If you are being treated for a sexually transmitted infection, avoid sexual contact until you have finished your treatment. Your sexual partner may also need treatment. Avoid antacids, aluminum, calcium, magnesium, and iron products for 4 hours before and 2 hours after taking a dose of this medicine. If you are using this medicine to prevent malaria, you should still protect yourself from contact with mosquitos. Stay in  screened-in areas, use mosquito nets, keep your body covered, and use an insect repellent. What side effects may I notice from receiving this medicine? Side effects that you should report to your doctor or health care professional as soon as possible: -allergic reactions like skin rash, itching or hives, swelling of the face, lips, or tongue -difficulty breathing -fever -itching in the rectal or genital area -pain on swallowing -redness, blistering, peeling or loosening of the skin, including inside the mouth -severe stomach pain or cramps -unusual bleeding or bruising -unusually weak or tired -yellowing of the eyes or skin Side effects that usually do not require medical attention (report to your doctor or health care professional if they continue or are bothersome): -diarrhea -loss of appetite -nausea, vomiting This list may not describe all possible side effects. Call your doctor for medical advice about side effects. You may report side effects to FDA at 1-800-FDA-1088. Where should I keep my medicine? Keep out of the reach of children. Store at room temperature, below 30 degrees C (86 degrees F). Protect from light. Keep container tightly closed. Throw away any unused medicine after the expiration date. Taking this medicine after the expiration date can make you seriously ill. NOTE: This sheet is a summary. It may not cover all possible information. If you have questions about this medicine, talk to your doctor, pharmacist, or health care provider.    2016, Elsevier/Gold Standard. (2015-01-03 12:10:28)  Acetaminophen; Oxycodone tablets What is this medicine? ACETAMINOPHEN; OXYCODONE (a set a MEE noe fen; ox i KOE done) is a pain reliever. It is used to treat moderate to severe pain. This medicine may be used for other purposes; ask your health care provider or pharmacist if you have questions. What should I tell my health care provider before I take this medicine? They need to  know if you have any of these conditions: -brain tumor -Crohn's disease, inflammatory bowel disease, or ulcerative colitis -drug abuse or addiction -head injury -heart or circulation problems -if you often drink alcohol -kidney disease or problems going to the bathroom -liver disease -lung disease, asthma, or breathing problems -an unusual or allergic reaction to acetaminophen, oxycodone, other opioid analgesics, other medicines, foods, dyes, or preservatives -pregnant or trying to get pregnant -breast-feeding How should I use this medicine? Take this medicine by mouth with a full glass of water. Follow the directions on the prescription label. You can take it with or without food. If it upsets your stomach, take it with food. Take your medicine at regular intervals. Do not take it more often than directed. Talk to your pediatrician regarding the use of this medicine in children. Special care may be needed. Patients over 38 years old may have a stronger reaction and need a smaller dose. Overdosage: If you think you have taken too much of this medicine contact a poison control center or  emergency room at once. NOTE: This medicine is only for you. Do not share this medicine with others. What if I miss a dose? If you miss a dose, take it as soon as you can. If it is almost time for your next dose, take only that dose. Do not take double or extra doses. What may interact with this medicine? -alcohol -antihistamines -barbiturates like amobarbital, butalbital, butabarbital, methohexital, pentobarbital, phenobarbital, thiopental, and secobarbital -benztropine -drugs for bladder problems like solifenacin, trospium, oxybutynin, tolterodine, hyoscyamine, and methscopolamine -drugs for breathing problems like ipratropium and tiotropium -drugs for certain stomach or intestine problems like propantheline, homatropine methylbromide, glycopyrrolate, atropine, belladonna, and dicyclomine -general  anesthetics like etomidate, ketamine, nitrous oxide, propofol, desflurane, enflurane, halothane, isoflurane, and sevoflurane -medicines for depression, anxiety, or psychotic disturbances -medicines for sleep -muscle relaxants -naltrexone -narcotic medicines (opiates) for pain -phenothiazines like perphenazine, thioridazine, chlorpromazine, mesoridazine, fluphenazine, prochlorperazine, promazine, and trifluoperazine -scopolamine -tramadol -trihexyphenidyl This list may not describe all possible interactions. Give your health care provider a list of all the medicines, herbs, non-prescription drugs, or dietary supplements you use. Also tell them if you smoke, drink alcohol, or use illegal drugs. Some items may interact with your medicine. What should I watch for while using this medicine? Tell your doctor or health care professional if your pain does not go away, if it gets worse, or if you have new or a different type of pain. You may develop tolerance to the medicine. Tolerance means that you will need a higher dose of the medication for pain relief. Tolerance is normal and is expected if you take this medicine for a long time. Do not suddenly stop taking your medicine because you may develop a severe reaction. Your body becomes used to the medicine. This does NOT mean you are addicted. Addiction is a behavior related to getting and using a drug for a non-medical reason. If you have pain, you have a medical reason to take pain medicine. Your doctor will tell you how much medicine to take. If your doctor wants you to stop the medicine, the dose will be slowly lowered over time to avoid any side effects. You may get drowsy or dizzy. Do not drive, use machinery, or do anything that needs mental alertness until you know how this medicine affects you. Do not stand or sit up quickly, especially if you are an older patient. This reduces the risk of dizzy or fainting spells. Alcohol may interfere with the  effect of this medicine. Avoid alcoholic drinks. There are different types of narcotic medicines (opiates) for pain. If you take more than one type at the same time, you may have more side effects. Give your health care provider a list of all medicines you use. Your doctor will tell you how much medicine to take. Do not take more medicine than directed. Call emergency for help if you have problems breathing. The medicine will cause constipation. Try to have a bowel movement at least every 2 to 3 days. If you do not have a bowel movement for 3 days, call your doctor or health care professional. Do not take Tylenol (acetaminophen) or medicines that have acetaminophen with this medicine. Too much acetaminophen can be very dangerous. Many nonprescription medicines contain acetaminophen. Always read the labels carefully to avoid taking more acetaminophen. What side effects may I notice from receiving this medicine? Side effects that you should report to your doctor or health care professional as soon as possible: -allergic reactions like skin rash, itching or  hives, swelling of the face, lips, or tongue -breathing difficulties, wheezing -confusion -light headedness or fainting spells -severe stomach pain -unusually weak or tired -yellowing of the skin or the whites of the eyes Side effects that usually do not require medical attention (report to your doctor or health care professional if they continue or are bothersome): -dizziness -drowsiness -nausea -vomiting This list may not describe all possible side effects. Call your doctor for medical advice about side effects. You may report side effects to FDA at 1-800-FDA-1088. Where should I keep my medicine? Keep out of the reach of children. This medicine can be abused. Keep your medicine in a safe place to protect it from theft. Do not share this medicine with anyone. Selling or giving away this medicine is dangerous and against the law. This medicine  may cause accidental overdose and death if it taken by other adults, children, or pets. Mix any unused medicine with a substance like cat litter or coffee grounds. Then throw the medicine away in a sealed container like a sealed bag or a coffee can with a lid. Do not use the medicine after the expiration date. Store at room temperature between 20 and 25 degrees C (68 and 77 degrees F). NOTE: This sheet is a summary. It may not cover all possible information. If you have questions about this medicine, talk to your doctor, pharmacist, or health care provider.    2016, Elsevier/Gold Standard. (2014-08-14 15:18:46)

## 2016-03-11 NOTE — ED Notes (Signed)
Patient d/c'd self care with family member driving patient home.  F/U and medications reviewed.  Patient verbalized understanding.

## 2016-03-11 NOTE — ED Notes (Signed)
MD at bedside. 
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
## 2016-04-26 ENCOUNTER — Inpatient Hospital Stay (HOSPITAL_COMMUNITY)
Admission: AD | Admit: 2016-04-26 | Discharge: 2016-04-26 | Disposition: A | Discharge status: Home / Self Care | Payer: Self-pay | Source: Ambulatory Visit | Attending: Obstetrics and Gynecology | Admitting: Obstetrics and Gynecology

## 2016-04-26 ENCOUNTER — Inpatient Hospital Stay (HOSPITAL_COMMUNITY): Payer: Self-pay

## 2016-04-26 ENCOUNTER — Encounter (HOSPITAL_COMMUNITY): Payer: Self-pay | Admitting: *Deleted

## 2016-04-26 DIAGNOSIS — O26891 Other specified pregnancy related conditions, first trimester: Secondary | ICD-10-CM | POA: Insufficient documentation

## 2016-04-26 DIAGNOSIS — K5900 Constipation, unspecified: Secondary | ICD-10-CM

## 2016-04-26 DIAGNOSIS — K5909 Other constipation: Secondary | ICD-10-CM | POA: Insufficient documentation

## 2016-04-26 DIAGNOSIS — Z3A08 8 weeks gestation of pregnancy: Secondary | ICD-10-CM | POA: Insufficient documentation

## 2016-04-26 DIAGNOSIS — R109 Unspecified abdominal pain: Secondary | ICD-10-CM

## 2016-04-26 DIAGNOSIS — Z881 Allergy status to other antibiotic agents status: Secondary | ICD-10-CM | POA: Insufficient documentation

## 2016-04-26 DIAGNOSIS — R1084 Generalized abdominal pain: Secondary | ICD-10-CM | POA: Insufficient documentation

## 2016-04-26 DIAGNOSIS — O99611 Diseases of the digestive system complicating pregnancy, first trimester: Secondary | ICD-10-CM

## 2016-04-26 DIAGNOSIS — O26899 Other specified pregnancy related conditions, unspecified trimester: Secondary | ICD-10-CM

## 2016-04-26 DIAGNOSIS — Z3A01 Less than 8 weeks gestation of pregnancy: Secondary | ICD-10-CM

## 2016-04-26 DIAGNOSIS — M549 Dorsalgia, unspecified: Secondary | ICD-10-CM | POA: Insufficient documentation

## 2016-04-26 LAB — WET PREP, GENITAL
CLUE CELLS WET PREP: NONE SEEN
SPERM: NONE SEEN
TRICH WET PREP: NONE SEEN
Yeast Wet Prep HPF POC: NONE SEEN

## 2016-04-26 LAB — POCT PREGNANCY, URINE: Preg Test, Ur: POSITIVE — AB

## 2016-04-26 LAB — URINALYSIS, ROUTINE W REFLEX MICROSCOPIC
Bilirubin Urine: NEGATIVE
Glucose, UA: NEGATIVE mg/dL
Hgb urine dipstick: NEGATIVE
Ketones, ur: NEGATIVE mg/dL
Leukocytes, UA: NEGATIVE
Nitrite: NEGATIVE
Protein, ur: NEGATIVE mg/dL
Specific Gravity, Urine: >1.03 — ABNORMAL HIGH (ref 1.005–1.030)
pH: 6 (ref 5.0–8.0)

## 2016-04-26 LAB — CBC
HEMATOCRIT: 34.9 % — AB (ref 36.0–46.0)
HEMOGLOBIN: 12.3 g/dL (ref 12.0–15.0)
MCH: 26.6 pg (ref 26.0–34.0)
MCHC: 35.2 g/dL (ref 30.0–36.0)
MCV: 75.5 fL — ABNORMAL LOW (ref 78.0–100.0)
Platelets: 306 10*3/uL (ref 150–400)
RBC: 4.62 MIL/uL (ref 3.87–5.11)
RDW: 13.7 % (ref 11.5–15.5)
WBC: 14 10*3/uL — ABNORMAL HIGH (ref 4.0–10.5)

## 2016-04-26 LAB — HCG, QUANTITATIVE, PREGNANCY: hCG, Beta Chain, Quant, S: 92468 m[IU]/mL — ABNORMAL HIGH (ref <5)

## 2016-04-26 MED ORDER — PROMETHAZINE HCL 25 MG PO TABS
25.0000 mg | ORAL_TABLET | Freq: Once | ORAL | Status: AC
  Administered 2016-04-26: 25 mg via ORAL
  Filled 2016-04-26: qty 1

## 2016-04-26 MED ORDER — POLYETHYLENE GLYCOL 3350 17 G PO PACK: 17.0000 g | PACK | Freq: Every day | ORAL | 0 refills | Status: DC

## 2016-04-26 NOTE — MAU Note (Signed)
Been having a lot of abd pain and back pain, started on Friday.  Feeling nauseous. Has been constipated.  Took some MOM, had results, but poor

## 2016-04-26 NOTE — MAU Provider Note (Signed)
Chief Complaint:  Abdominal Pain and Back Pain   First Provider Initiated Contact with Patient 04/26/16 1353      Julia Owens is a 30 y.o. G3P1021 at 56 w2d by LMP who presents to maternity admissions reporting Diffuse abdominal pain since Friday. Has been constipated.  THis is a chronic problem for her. Tried MOM with some success. No bleeding..+ nausea She denies vaginal bleeding, vaginal itching/burning, urinary symptoms, h/a, dizziness, diarrhea, or fever/chills.    Was seen in ER mid June with Neg UPT.  LMP 03/13/16.  Abdominal Pain  This is a recurrent problem. The current episode started in the past 7 days. The onset quality is gradual. The problem occurs intermittently. The problem has been unchanged. The pain is located in the generalized abdominal region. The pain is moderate. The quality of the pain is cramping and colicky. The abdominal pain does not radiate. Associated symptoms include constipation and nausea. Pertinent negatives include no diarrhea, dysuria, fever or vomiting. The pain is aggravated by palpation. The pain is relieved by nothing.   RN Note: Been having a lot of abd pain and back pain, started on Friday.  Feeling nauseous. Has been constipated.  Took some MOM, had results, but poor  Past Medical History: Past Medical History:  Diagnosis Date  . Abscessed tooth    current  . Gestational diabetes   . Gestational diabetes   . Trichomonas vaginitis   . Vaginal Pap smear, abnormal     Past obstetric history: OB History  Gravida Para Term Preterm AB Living  3 1 1  0 2 1  SAB TAB Ectopic Multiple Live Births  0 2 0 0 1    # Outcome Date GA Lbr Len/2nd Weight Sex Delivery Anes PTL Lv  3 Term 09/25/14 [redacted]w[redacted]d  7 lb 14 oz (3.572 kg) F CS-LTranv EPI  LIV  2 TAB           1 TAB               Past Surgical History: Past Surgical History:  Procedure Laterality Date  . CESAREAN SECTION N/A 09/25/2014   Procedure: CESAREAN SECTION;  Surgeon: Frederico Hamman,  MD;  Location: [facility];  Service: Obstetrics;  Laterality: N/A;  . COLPOSCOPY    . DILATION AND EVACUATION      Family History: Family History  Problem Relation Age of Onset  . Diabetes Father     Social History: Social History  Substance Use Topics  . Smoking status: Never Smoker  . Smokeless tobacco: Never Used  . Alcohol use Yes     Comment: socially drinks    Allergies:  Allergies  Allergen Reactions  . Flagyl [Metronidazole] Hives    Meds:  Prescriptions Prior to Admission  Medication Sig Dispense Refill Last Dose  . cyclobenzaprine (FLEXERIL) 10 MG tablet Take 1 tablet (10 mg total) by mouth 3 (three) times daily as needed for muscle spasms. 30 tablet 0   . doxycycline (VIBRAMYCIN) 100 MG capsule Take 1 capsule (100 mg total) by mouth 2 (two) times daily. One po bid x 7 days 14 capsule 0   . oxyCODONE-acetaminophen (PERCOCET) 5-325 MG tablet Take 1 tablet by mouth every 4 (four) hours as needed for moderate pain. 20 tablet 0     I have reviewed patient's Past Medical Hx, Surgical Hx, Family Hx, Social Hx, medications and allergies.   ROS:  Review of Systems  Constitutional: Negative for chills and fever.  Respiratory: Negative for shortness  of breath.   Gastrointestinal: Positive for abdominal pain, constipation and nausea. Negative for diarrhea and vomiting.  Genitourinary: Negative for dysuria, vaginal bleeding and vaginal discharge.  Musculoskeletal: Negative for back pain.   Other systems negative  Physical Exam  Patient Vitals for the past 24 hrs:  BP Temp Temp src Pulse Resp Height Weight  04/26/16 1327 124/77 98.5 F (36.9 C) Oral 89 18 5\' 3"  (1.6 m) 195 lb 12.8 oz (88.8 kg)   Constitutional: Well-developed, well-nourished female in no acute distress.  Cardiovascular: normal rate and rhythm Respiratory: normal effort, clear to auscultation bilaterally GI: Abd soft, diffusely tender, gravid appropriate for gestational age.   No rebound or  guarding. MS: Extremities nontender, no edema, normal ROM Neurologic: Alert and oriented x 4.  GU: Neg CVAT.  PELVIC EXAM: Cervix pink, visually closed, without lesion, scant white creamy discharge, vaginal walls and external genitalia normal Bimanual exam: Cervix firm, posterior, neg CMT, uterus nontender, Fundal Height consistent with dates, adnexa without tenderness, enlargement, or mass   Labs: A+ Results for orders placed or performed during the hospital encounter of 04/26/16 (from the past 24 hour(s))  Urinalysis, Routine w reflex microscopic (not at [facility])     Status: Abnormal   Collection Time: 04/26/16  1:30 PM  Result Value Ref Range   Color, Urine YELLOW YELLOW   APPearance HAZY (A) CLEAR   Specific Gravity, Urine >1.030 (H) 1.005 - 1.030   pH 6.0 5.0 - 8.0   Glucose, UA NEGATIVE NEGATIVE mg/dL   Hgb urine dipstick NEGATIVE NEGATIVE   Bilirubin Urine NEGATIVE NEGATIVE   Ketones, ur NEGATIVE NEGATIVE mg/dL   Protein, ur NEGATIVE NEGATIVE mg/dL   Nitrite NEGATIVE NEGATIVE   Leukocytes, UA NEGATIVE NEGATIVE  Pregnancy, urine POC     Status: Abnormal   Collection Time: 04/26/16  1:37 PM  Result Value Ref Range   Preg Test, Ur POSITIVE (A) NEGATIVE  CBC     Status: Abnormal   Collection Time: 04/26/16  2:12 PM  Result Value Ref Range   WBC 14.0 (H) 4.0 - 10.5 K/uL   RBC 4.62 3.87 - 5.11 MIL/uL   Hemoglobin 12.3 12.0 - 15.0 g/dL   HCT 34.9 (L) 36.0 - 46.0 %   MCV 75.5 (L) 78.0 - 100.0 fL   MCH 26.6 26.0 - 34.0 pg   MCHC 35.2 30.0 - 36.0 g/dL   RDW 13.7 11.5 - 15.5 %   Platelets 306 150 - 400 K/uL  hCG, quantitative, pregnancy     Status: Abnormal   Collection Time: 04/26/16  2:12 PM  Result Value Ref Range   hCG, Beta Chain, Quant, S 92,468 (H) <5 mIU/mL  Wet prep, genital     Status: Abnormal   Collection Time: 04/26/16  2:22 PM  Result Value Ref Range   Yeast Wet Prep HPF POC NONE SEEN NONE SEEN   Trich, Wet Prep NONE SEEN NONE SEEN   Clue Cells Wet Prep HPF  POC NONE SEEN NONE SEEN   WBC, Wet Prep HPF POC FEW (A) NONE SEEN   Sperm NONE SEEN     Imaging:  No results found.  MAU Course/MDM: Ordered usual first trimester r/o ectopic labs.   Pelvic exam and cultures done Will check baseline Ultrasound to rule out ectopic.  This pain can represent a normal pregnancy with bleeding, spontaneous abortion or even an ectopic which can be life-threatening.  The process as listed above helps to determine which of these is present.  Reviewed normal SIUP seen on [place] Proof of pregnancy letter given Will Rx Miralax  Assessment: SIUP at [redacted]w[redacted]d Chronic constipation  Plan: Discharge home Rx Miralax Follow up in Office for prenatal visits     Medication List    ASK your doctor about these medications   cyclobenzaprine 10 MG tablet Commonly known as:  FLEXERIL Take 1 tablet (10 mg total) by mouth 3 (three) times daily as needed for muscle spasms.   doxycycline 100 MG capsule Commonly known as:  VIBRAMYCIN Take 1 capsule (100 mg total) by mouth 2 (two) times daily. One po bid x 7 days   oxyCODONE-acetaminophen 5-325 MG tablet Commonly known as:  PERCOCET Take 1 tablet by mouth every 4 (four) hours as needed for moderate pain.      Pt stable at time of discharge. Encouraged to return here or to other Urgent Care/ED if she develops worsening of symptoms, increase in pain, fever, or other concerning symptoms.      Hansel Feinstein CNM, MSN Certified Nurse-Midwife 04/26/2016 1:53 PM

## 2016-04-26 NOTE — Discharge Instructions (Signed)
First Trimester of Pregnancy  The first trimester of pregnancy is from week 1 until the end of week 12 (months 1 through 3). A week after a sperm fertilizes an egg, the egg will implant on the wall of the uterus. This embryo will begin to develop into a baby. Genes from you and your partner are forming the baby. The female genes determine whether the baby is a boy or a girl. At 6-8 weeks, the eyes and face are formed, and the heartbeat can be seen on ultrasound. At the end of 12 weeks, all the baby's organs are formed.   Now that you are pregnant, you will want to do everything you can to have a healthy baby. Two of the most important things are to get good prenatal care and to follow your health care provider's instructions. Prenatal care is all the medical care you receive before the baby's birth. This care will help prevent, find, and treat any problems during the pregnancy and childbirth.  BODY CHANGES  Your body goes through many changes during pregnancy. The changes vary from woman to woman.   · You may gain or lose a couple of pounds at first.  · You may feel sick to your stomach (nauseous) and throw up (vomit). If the vomiting is uncontrollable, call your health care provider.  · You may tire easily.  · You may develop headaches that can be relieved by medicines approved by your health care provider.  · You may urinate more often. Painful urination may mean you have a bladder infection.  · You may develop heartburn as a result of your pregnancy.  · You may develop constipation because certain hormones are causing the muscles that push waste through your intestines to slow down.  · You may develop hemorrhoids or swollen, bulging veins (varicose veins).  · Your breasts may begin to grow larger and become tender. Your nipples may stick out more, and the tissue that surrounds them (areola) may become darker.  · Your gums may bleed and may be sensitive to brushing and flossing.   · Dark spots or blotches (chloasma, mask of pregnancy) may develop on your face. This will likely fade after the baby is born.  · Your menstrual periods will stop.  · You may have a loss of appetite.  · You may develop cravings for certain kinds of food.  · You may have changes in your emotions from day to day, such as being excited to be pregnant or being concerned that something may go wrong with the pregnancy and baby.  · You may have more vivid and strange dreams.  · You may have changes in your hair. These can include thickening of your hair, rapid growth, and changes in texture. Some women also have hair loss during or after pregnancy, or hair that feels dry or thin. Your hair will most likely return to normal after your baby is born.  WHAT TO EXPECT AT YOUR PRENATAL VISITS  During a routine prenatal visit:  · You will be weighed to make sure you and the baby are growing normally.  · Your blood pressure will be taken.  · Your abdomen will be measured to track your baby's growth.  · The fetal heartbeat will be listened to starting around week 10 or 12 of your pregnancy.  · Test results from any previous visits will be discussed.  Your health care provider may ask you:  · How you are feeling.  · If you   including cigarettes, chewing tobacco, and electronic cigarettes.  If you have any questions. Other tests that may be performed during your first trimester include:  Blood tests to find your blood type and to check for the presence of any previous infections. They will also be used to check for low iron levels (anemia) and Rh antibodies. Later in the pregnancy, blood tests for diabetes will be done along with other tests if problems develop.  Urine tests to check for infections, diabetes, or protein in the urine.  An ultrasound to confirm the proper growth  and development of the baby.  An amniocentesis to check for possible genetic problems.  Fetal screens for spina bifida and Down syndrome.  You may need other tests to make sure you and the baby are doing well.  HIV (human immunodeficiency virus) testing. Routine prenatal testing includes screening for HIV, unless you choose not to have this test. HOME CARE INSTRUCTIONS  Medicines  Follow your health care provider's instructions regarding medicine use. Specific medicines may be either safe or unsafe to take during pregnancy.  Take your prenatal vitamins as directed.  If you develop constipation, try taking a stool softener if your health care provider approves. Diet  Eat regular, well-balanced meals. Choose a variety of foods, such as meat or vegetable-based protein, fish, milk and low-fat dairy products, vegetables, fruits, and whole grain breads and cereals. Your health care provider will help you determine the amount of weight gain that is right for you.  Avoid raw meat and uncooked cheese. These carry germs that can cause birth defects in the baby.  Eating four or five small meals rather than three large meals a day may help relieve nausea and vomiting. If you start to feel nauseous, eating a few soda crackers can be helpful. Drinking liquids between meals instead of during meals also seems to help nausea and vomiting.  If you develop constipation, eat more high-fiber foods, such as fresh vegetables or fruit and whole grains. Drink enough fluids to keep your urine clear or pale yellow. Activity and Exercise  Exercise only as directed by your health care provider. Exercising will help you:  Control your weight.  Stay in shape.  Be prepared for labor and delivery.  Experiencing pain or cramping in the lower abdomen or low back is a good sign that you should stop exercising. Check with your health care provider before continuing normal exercises.  Try to avoid standing for long  periods of time. Move your legs often if you must stand in one place for a long time.  Avoid heavy lifting.  Wear low-heeled shoes, and practice good posture.  You may continue to have sex unless your health care provider directs you otherwise. Relief of Pain or Discomfort  Wear a good support bra for breast tenderness.   Take warm sitz baths to soothe any pain or discomfort caused by hemorrhoids. Use hemorrhoid cream if your health care provider approves.   Rest with your legs elevated if you have leg cramps or low back pain.  If you develop varicose veins in your legs, wear support hose. Elevate your feet for 15 minutes, 3-4 times a day. Limit salt in your diet. Prenatal Care  Schedule your prenatal visits by the twelfth week of pregnancy. They are usually scheduled monthly at first, then more often in the last 2 months before delivery.  Write down your questions. Take them to your prenatal visits.  Keep all your prenatal visits as directed by your  health care provider. Safety  Wear your seat belt at all times when driving.  Make a list of emergency phone numbers, including numbers for family, friends, the hospital, and police and fire departments. General Tips  Ask your health care provider for a referral to a local prenatal education class. Begin classes no later than at the beginning of month 6 of your pregnancy.  Ask for help if you have counseling or nutritional needs during pregnancy. Your health care provider can offer advice or refer you to specialists for help with various needs.  Do not use hot tubs, steam rooms, or saunas.  Do not douche or use tampons or scented sanitary pads.  Do not cross your legs for long periods of time.  Avoid cat litter boxes and soil used by cats. These carry germs that can cause birth defects in the baby and possibly loss of the fetus by miscarriage or stillbirth.  Avoid all smoking, herbs, alcohol, and medicines not prescribed by  your health care provider. Chemicals in these affect the formation and growth of the baby.  Do not use any tobacco products, including cigarettes, chewing tobacco, and electronic cigarettes. If you need help quitting, ask your health care provider. You may receive counseling support and other resources to help you quit.  Schedule a dentist appointment. At home, brush your teeth with a soft toothbrush and be gentle when you floss. SEEK MEDICAL CARE IF:   You have dizziness.  You have mild pelvic cramps, pelvic pressure, or nagging pain in the abdominal area.  You have persistent nausea, vomiting, or diarrhea.  You have a bad smelling vaginal discharge.  You have pain with urination.  You notice increased swelling in your face, hands, legs, or ankles. SEEK IMMEDIATE MEDICAL CARE IF:   You have a fever.  You are leaking fluid from your vagina.  You have spotting or bleeding from your vagina.  You have severe abdominal cramping or pain.  You have rapid weight gain or loss.  You vomit blood or material that looks like coffee grounds.  You are exposed to [place] measles and have never had them.  You are exposed to fifth disease or chickenpox.  You develop a severe headache.  You have shortness of breath.  You have any kind of trauma, such as from a fall or a car accident.   This information is not intended to replace advice given to you by your health care provider. Make sure you discuss any questions you have with your health care provider.   Document Released: 09/07/2001 Document Revised: 10/04/2014 Document Reviewed: 07/24/2013 Elsevier Interactive Patient Education 2016 [employer]. Constipation, Adult Constipation is when a person has fewer than three bowel movements a week, has difficulty having a bowel movement, or has stools that are dry, hard, or larger than normal. As people grow older, constipation is more common. A low-fiber diet, not taking in enough fluids, and  taking certain medicines may make constipation worse.  CAUSES   Certain medicines, such as antidepressants, pain medicine, iron supplements, antacids, and water pills.   Certain diseases, such as diabetes, irritable bowel syndrome (IBS), thyroid disease, or depression.   Not drinking enough water.   Not eating enough fiber-rich foods.   Stress or travel.   Lack of physical activity or exercise.   Ignoring the urge to have a bowel movement.   Using laxatives too much.  SIGNS AND SYMPTOMS   Having fewer than three bowel movements a week.   Straining  to have a bowel movement.   Having stools that are hard, dry, or larger than normal.   Feeling full or bloated.   Pain in the lower abdomen.   Not feeling relief after having a bowel movement.  DIAGNOSIS  Your health care provider will take a medical history and perform a physical exam. Further testing may be done for severe constipation. Some tests may include:  A barium enema X-ray to examine your rectum, colon, and, sometimes, your small intestine.   A sigmoidoscopy to examine your lower colon.   A colonoscopy to examine your entire colon. TREATMENT  Treatment will depend on the severity of your constipation and what is causing it. Some dietary treatments include drinking more fluids and eating more fiber-rich foods. Lifestyle treatments may include regular exercise. If these diet and lifestyle recommendations do not help, your health care provider may recommend taking over-the-counter laxative medicines to help you have bowel movements. Prescription medicines may be prescribed if over-the-counter medicines do not work.  HOME CARE INSTRUCTIONS   Eat foods that have a lot of fiber, such as fruits, vegetables, whole grains, and beans.  Limit foods high in fat and processed sugars, such as french fries, hamburgers, cookies, candies, and soda.   A fiber supplement may be added to your diet if you cannot get  enough fiber from foods.   Drink enough fluids to keep your urine clear or pale yellow.   Exercise regularly or as directed by your health care provider.   Go to the restroom when you have the urge to go. Do not hold it.   Only take over-the-counter or prescription medicines as directed by your health care provider. Do not take other medicines for constipation without talking to your health care provider first.  [place] IF:   You have bright red blood in your stool.   Your constipation lasts for more than 4 days or gets worse.   You have abdominal or rectal pain.   You have thin, pencil-like stools.   You have unexplained weight loss. MAKE SURE YOU:   Understand these instructions.  Will watch your condition.  Will get help right away if you are not doing well or get worse.   This information is not intended to replace advice given to you by your health care provider. Make sure you discuss any questions you have with your health care provider.   Document Released: 06/11/2004 Document Revised: 10/04/2014 Document Reviewed: 06/25/2013 Elsevier Interactive Patient Education [employer].

## 2016-04-27 LAB — GC/CHLAMYDIA PROBE AMP (~~LOC~~) NOT AT ARMC
CHLAMYDIA, DNA PROBE: NEGATIVE
Neisseria Gonorrhea: NEGATIVE

## 2016-04-27 LAB — HIV ANTIBODY (ROUTINE TESTING W REFLEX): HIV SCREEN 4TH GENERATION: NONREACTIVE

## 2016-05-03 ENCOUNTER — Emergency Department (HOSPITAL_COMMUNITY): Payer: Self-pay

## 2016-05-03 ENCOUNTER — Emergency Department (HOSPITAL_COMMUNITY)
Admission: EM | Admit: 2016-05-03 | Discharge: 2016-05-03 | Disposition: A | Discharge status: Home / Self Care | Payer: Self-pay | Attending: Emergency Medicine | Admitting: Emergency Medicine

## 2016-05-03 DIAGNOSIS — Z3A09 9 weeks gestation of pregnancy: Secondary | ICD-10-CM | POA: Insufficient documentation

## 2016-05-03 DIAGNOSIS — N939 Abnormal uterine and vaginal bleeding, unspecified: Secondary | ICD-10-CM

## 2016-05-03 DIAGNOSIS — O418X10 Other specified disorders of amniotic fluid and membranes, first trimester, not applicable or unspecified: Secondary | ICD-10-CM

## 2016-05-03 DIAGNOSIS — O468X1 Other antepartum hemorrhage, first trimester: Secondary | ICD-10-CM

## 2016-05-03 DIAGNOSIS — O208 Other hemorrhage in early pregnancy: Secondary | ICD-10-CM | POA: Insufficient documentation

## 2016-05-03 LAB — CBC WITH DIFFERENTIAL/PLATELET
Basophils Absolute: 0 10*3/uL (ref 0.0–0.1)
Basophils Relative: 0 %
EOS PCT: 1 %
Eosinophils Absolute: 0.1 10*3/uL (ref 0.0–0.7)
HEMATOCRIT: 35.2 % — AB (ref 36.0–46.0)
HEMOGLOBIN: 12.2 g/dL (ref 12.0–15.0)
LYMPHS ABS: 2.8 10*3/uL (ref 0.7–4.0)
LYMPHS PCT: 19 %
MCH: 26.5 pg (ref 26.0–34.0)
MCHC: 34.7 g/dL (ref 30.0–36.0)
MCV: 76.5 fL — AB (ref 78.0–100.0)
Monocytes Absolute: 0.7 10*3/uL (ref 0.1–1.0)
Monocytes Relative: 5 %
NEUTROS ABS: 11.3 10*3/uL — AB (ref 1.7–7.7)
NEUTROS PCT: 75 %
Platelets: 306 10*3/uL (ref 150–400)
RBC: 4.6 MIL/uL (ref 3.87–5.11)
RDW: 13.7 % (ref 11.5–15.5)
WBC: 15 10*3/uL — AB (ref 4.0–10.5)

## 2016-05-03 LAB — I-STAT BETA HCG BLOOD, ED (MC, WL, AP ONLY): I-stat hCG, quantitative: >2000 m[IU]/mL — ABNORMAL HIGH (ref <5)

## 2016-05-03 LAB — ABO/RH: ABO/RH(D): A POS

## 2016-05-03 MED ORDER — ACETAMINOPHEN 500 MG PO TABS
1000.0000 mg | ORAL_TABLET | Freq: Once | ORAL | Status: AC
  Administered 2016-05-03: 1000 mg via ORAL
  Filled 2016-05-03: qty 2

## 2016-05-03 MED ORDER — SODIUM CHLORIDE 0.9 % IV BOLUS (SEPSIS): 1000.0000 mL | Freq: Once | INTRAVENOUS | Status: DC

## 2016-05-03 NOTE — ED Notes (Signed)
US at bedside

## 2016-05-03 NOTE — ED Triage Notes (Signed)
Pt states that she is [redacted] weeks pregnant and started vaginally bleeding heavily about 10 minutes ago. Alert and oriented.

## 2016-05-03 NOTE — ED Provider Notes (Addendum)
[place] DEPT Provider Note   CSN: [id number] Arrival date & time: 05/03/16  0217  By signing my name below, I, Gwenlyn Fudge, attest that this documentation has been prepared under the direction and in the presence of Everlene Balls, MD. Electronically Signed: Gwenlyn Fudge, ED Scribe. 05/03/16. 2:44 AM.  First MD Initiated Contact with Patient 05/03/16 0229    History   Chief Complaint Chief Complaint  Patient presents with  . Vaginal Bleeding   The history is provided by the patient. No language interpreter was used.    HPI Comments: Julia Owens is a 30 y.o. female who presents to the Emergency Department complaining of vaginal bleeding onset 10:15 PM tonight. Pt states she is currently [redacted] weeks pregnant. Pt denies passing clots in blood. Pt reports associated headache, light headedness, dizziness. She states she had an ultrasound last week at [facility] that confirmed she was [redacted] weeks pregnant. Pt denies any prior problems with pregnancy. Pt denies cramping.  Past Medical History:  Diagnosis Date  . Abscessed tooth    current  . Gestational diabetes   . Gestational diabetes   . Trichomonas vaginitis   . Vaginal Pap smear, abnormal     Patient Active Problem List   Diagnosis Date Noted  . PCOS (polycystic ovarian syndrome) 07/08/2015  . Fibroids 07/08/2015    Past Surgical History:  Procedure Laterality Date  . CESAREAN SECTION N/A 09/25/2014   Procedure: CESAREAN SECTION;  Surgeon: Frederico Hamman, MD;  Location: [facility];  Service: Obstetrics;  Laterality: N/A;  . COLPOSCOPY    . DILATION AND EVACUATION      OB History    Gravida Para Term Preterm AB Living   4 1 1  0 2 1   SAB TAB Ectopic Multiple Live Births   0 2 0 0 1      Home Medications    Prior to Admission medications   Medication Sig Start Date End Date Taking? Authorizing Provider  cyclobenzaprine (FLEXERIL) 10 MG tablet Take 1 tablet (10 mg total) by mouth 3 (three) times daily as needed for  muscle spasms. 0000000   Delora Fuel, MD  doxycycline (VIBRAMYCIN) 100 MG capsule Take 1 capsule (100 mg total) by mouth 2 (two) times daily. One po bid x 7 days 0000000   Delora Fuel, MD  oxyCODONE-acetaminophen (PERCOCET) 5-325 MG tablet Take 1 tablet by mouth every 4 (four) hours as needed for moderate pain. 0000000   Delora Fuel, MD  polyethylene glycol [facility]) packet Take 17 g by mouth daily. 04/26/16   Seabron Spates, CNM    Family History Family History  Problem Relation Age of Onset  . Diabetes Father     Social History Social History  Substance Use Topics  . Smoking status: Never Smoker  . Smokeless tobacco: Never Used  . Alcohol use Yes     Comment: socially drinks     Allergies   Flagyl [metronidazole]   Review of Systems Review of Systems 10 Systems reviewed and are negative for acute change except as noted in the HPI.  Physical Exam Updated Vital Signs BP 127/87 (BP Location: Left Arm)   Pulse 110   Temp 98.9 F (37.2 C) (Oral)   Resp 20   LMP 03/13/2016   SpO2 100%   Physical Exam   Physical Exam  Constitutional: She is oriented to person, place, and time. She appears well-developed and well-nourished. No distress.  HENT:  Head: Normocephalic and atraumatic.  Nose: Nose normal.  Mouth/Throat:  Oropharynx is clear and moist. No oropharyngeal exudate.  Eyes: Conjunctivae and EOM are normal. Pupils are equal, round, and reactive to light. No scleral icterus.  Neck: Normal range of motion. Neck supple. No JVD present. No tracheal deviation present. No thyromegaly present.  Cardiovascular: Normal rate, regular rhythm and normal heart sounds.  Exam reveals no gallop and no friction rub.   No murmur heard. Pulmonary/Chest: Effort normal and breath sounds normal. No respiratory distress. She has no wheezes. She exhibits no tenderness.  Abdominal: Soft. Bowel sounds are normal. She exhibits no distension and no mass. There is no tenderness. There is no  rebound and no guarding.  Genitourinary:  Genitourinary Comments: Vaginal bleeding noted-mild amount.  No CMT or adnexal tenderness.  Musculoskeletal: Normal range of motion. She exhibits no edema or tenderness.  Lymphadenopathy:    She has no cervical adenopathy.  Neurological: She is alert and oriented to person, place, and time. No cranial nerve deficit. She exhibits normal muscle tone.  Skin: Skin is warm and dry. No rash noted. No erythema. No pallor.  Nursing note and vitals reviewed.     ED Treatments / Results  DIAGNOSTIC STUDIES: Oxygen Saturation is 100% on RA, normal by my interpretation.    COORDINATION OF CARE: 2:31 AM Discussed treatment plan with pt at bedside which includes blood work, CBC and Ultrasound and pt agreed to plan.  Labs (all labs ordered are listed, but only abnormal results are displayed) Labs Reviewed  CBC WITH DIFFERENTIAL/PLATELET - Abnormal; Notable for the following:       Result Value   WBC 15.0 (*)    HCT 35.2 (*)    MCV 76.5 (*)    Neutro Abs 11.3 (*)    All other components within normal limits  I-STAT BETA HCG BLOOD, ED ([facility], [facility], [facility] ONLY) - Abnormal; Notable for the following:    I-stat hCG, quantitative >2,000.0 (*)    All other components within normal limits  ABO/RH    EKG  EKG Interpretation None       Radiology US Ob Limited  Result Date: 05/03/2016 CLINICAL DATA:  Vaginal bleeding. Gestational age by ultrasound 7 weeks and 2 days, gestational age by prior ultrasound 9 weeks and 5 days. G2 P1. EXAM: OBSTETRIC <14 WK ULTRASOUND TECHNIQUE: Transabdominal ultrasound was performed for evaluation of the gestation as well as the maternal uterus and adnexal regions. COMPARISON:  Obstetric ultrasound April 26, 2016 FINDINGS: Intrauterine gestational sac: Present Yolk sac:  Present Embryo:  Present Cardiac Activity: Present Heart Rate: 171 bpm CRL:   30.3  mm   9 w 6 d                  [place] [facility]: December 10, 2016 Subchorionic hemorrhage:   Small subchorionic hemorrhage. Maternal uterus/adnexae: Normal. IMPRESSION: Single live intrauterine pregnancy, gestational age by ultrasound 9 weeks and 6 days. Small subchorionic hemorrhage noted on this transabdominal pelvic ultrasound. Electronically Signed   By: Elon Alas M.D.   On: 05/03/2016 03:29    Procedures Procedures (including critical care time)  Medications Ordered in ED Medications  sodium chloride 0.9 % bolus 1,000 mL (not administered)  acetaminophen (TYLENOL) tablet 1,000 mg (1,000 mg Oral Given 05/03/16 0257)     Initial Impression / Assessment and Plan / ED Course  I have reviewed the triage vital signs and the nursing notes.  Pertinent labs & imaging results that were available during my care of the patient were reviewed by me and  considered in my medical decision making (see chart for details).  Clinical Course    Patient presents to the emergency department for vaginal bleeding in the setting of pregnancy. Last week she had a normal ultrasound a live IUP. This is likely not an ectopic pregnancy. Differential includes miscarriage versus subchorionic hemorrhage or threatened miscarriage. Will obtain ultrasound for evaluation. Also Quant and ABO Rh percent. Patient given Tylenol for headache.  3:42 AM ultrasound reveals subchorionic hemorrhage. Diagnoses explained to the patient and she wishes good understanding. OB follow-up advised within the next 3 days. She appears well in no acute distress, vital signs were within her normal limits and she is safe for discharge.  Final Clinical Impressions(s) / ED Diagnoses   Final diagnoses:  Vaginal bleeding    New Prescriptions New Prescriptions   No medications on file      I personally performed the services described in this documentation, which was scribed in my presence. The recorded information has been reviewed and is accurate.   Dictation #1 [id number]  [id number]    Everlene Balls, MD 05/03/16  [id number]    Everlene Balls, MD 06/17/16 1455

## 2016-06-17 ENCOUNTER — Encounter: Payer: Self-pay | Admitting: *Deleted

## 2016-06-30 ENCOUNTER — Emergency Department (HOSPITAL_COMMUNITY)
Admission: EM | Admit: 2016-06-30 | Discharge: 2016-06-30 | Disposition: A | Discharge status: Home / Self Care | Payer: Medicaid Other | Attending: Dermatology | Admitting: Dermatology

## 2016-06-30 ENCOUNTER — Inpatient Hospital Stay (HOSPITAL_COMMUNITY)
Admission: AD | Admit: 2016-06-30 | Discharge: 2016-06-30 | Discharge status: Against Medical Advice | Payer: Medicaid Other | Source: Ambulatory Visit | Attending: Obstetrics & Gynecology | Admitting: Obstetrics & Gynecology

## 2016-06-30 ENCOUNTER — Encounter: Payer: Self-pay | Admitting: Emergency Medicine

## 2016-06-30 DIAGNOSIS — R300 Dysuria: Secondary | ICD-10-CM | POA: Insufficient documentation

## 2016-06-30 DIAGNOSIS — Z5321 Procedure and treatment not carried out due to patient leaving prior to being seen by health care provider: Secondary | ICD-10-CM | POA: Insufficient documentation

## 2016-06-30 DIAGNOSIS — Z79899 Other long term (current) drug therapy: Secondary | ICD-10-CM | POA: Insufficient documentation

## 2016-06-30 NOTE — ED Notes (Signed)
No answer when called for reassessment of vital signs. 

## 2016-06-30 NOTE — Progress Notes (Signed)
Went to call name in lobby, admitting informed me the patient said she was leaving.

## 2016-06-30 NOTE — ED Triage Notes (Signed)
Pt states that she has had dysuria, lower abdominal pain and lower back pain. States she feels like she has a UTI. Alert and oriented.

## 2016-06-30 NOTE — ED Notes (Addendum)
No answer when called for reassessment of vitals.

## 2016-07-01 ENCOUNTER — Emergency Department (HOSPITAL_COMMUNITY)
Admission: EM | Admit: 2016-07-01 | Discharge: 2016-07-01 | Disposition: A | Discharge status: Home / Self Care | Payer: Medicaid Other | Attending: Emergency Medicine | Admitting: Emergency Medicine

## 2016-07-01 DIAGNOSIS — N12 Tubulo-interstitial nephritis, not specified as acute or chronic: Secondary | ICD-10-CM

## 2016-07-01 LAB — URINE MICROSCOPIC-ADD ON: RBC / HPF: NONE SEEN RBC/hpf (ref 0–5)

## 2016-07-01 LAB — POC URINE PREG, ED: PREG TEST UR: NEGATIVE

## 2016-07-01 LAB — URINALYSIS, ROUTINE W REFLEX MICROSCOPIC
Bilirubin Urine: NEGATIVE
Glucose, UA: NEGATIVE mg/dL
HGB URINE DIPSTICK: NEGATIVE
Ketones, ur: NEGATIVE mg/dL
NITRITE: NEGATIVE
PROTEIN: NEGATIVE mg/dL
Specific Gravity, Urine: 1.022 (ref 1.005–1.030)
pH: 6.5 (ref 5.0–8.0)

## 2016-07-01 MED ORDER — ONDANSETRON 4 MG PO TBDP: 4.0000 mg | ORAL_TABLET | Freq: Three times a day (TID) | ORAL | 0 refills | Status: DC | PRN

## 2016-07-01 MED ORDER — CEPHALEXIN 500 MG PO CAPS
500.0000 mg | ORAL_CAPSULE | Freq: Once | ORAL | Status: AC
  Administered 2016-07-01: 500 mg via ORAL
  Filled 2016-07-01: qty 1

## 2016-07-01 MED ORDER — CEPHALEXIN 500 MG PO CAPS: 500.0000 mg | ORAL_CAPSULE | Freq: Two times a day (BID) | ORAL | 0 refills | Status: DC

## 2016-07-01 NOTE — Discharge Instructions (Signed)
We saw you in the ER for the back pain, pelvic pain, urinary complains. All the results in the ER are normal. We suspect kidney infection - so we are starting you on antibiotics. No pelvic exam done as you reported being comfortable with your primary doctor's exam on Monday. IF YOUR SYMPTOMS GET WORSE - please return to the ER or see your doctor.
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
## 2016-07-01 NOTE — ED Triage Notes (Signed)
Pt states that she has had dysuria, lower abdominal pain and lower back pain. States she feels like she has a UTI. Alert and oriented. Came last night but had to leave.

## 2016-11-22 ENCOUNTER — Encounter (HOSPITAL_COMMUNITY): Payer: Self-pay | Admitting: Emergency Medicine

## 2016-11-22 ENCOUNTER — Emergency Department (HOSPITAL_COMMUNITY)
Admission: EM | Admit: 2016-11-22 | Discharge: 2016-11-22 | Disposition: A | Discharge status: Home / Self Care | Payer: Medicaid Other | Attending: Emergency Medicine | Admitting: Emergency Medicine

## 2016-11-22 DIAGNOSIS — R300 Dysuria: Secondary | ICD-10-CM | POA: Insufficient documentation

## 2016-11-22 DIAGNOSIS — Z5321 Procedure and treatment not carried out due to patient leaving prior to being seen by health care provider: Secondary | ICD-10-CM | POA: Insufficient documentation

## 2016-11-22 LAB — I-STAT BETA HCG BLOOD, ED (MC, WL, AP ONLY): I-stat hCG, quantitative: <5 m[IU]/mL (ref <5)

## 2016-11-22 LAB — CBC
HCT: 37.1 % (ref 36.0–46.0)
Hemoglobin: 13 g/dL (ref 12.0–15.0)
MCH: 26.5 pg (ref 26.0–34.0)
MCHC: 35 g/dL (ref 30.0–36.0)
MCV: 75.6 fL — AB (ref 78.0–100.0)
PLATELETS: 274 10*3/uL (ref 150–400)
RBC: 4.91 MIL/uL (ref 3.87–5.11)
RDW: 13.8 % (ref 11.5–15.5)
WBC: 14.5 10*3/uL — AB (ref 4.0–10.5)

## 2016-11-22 LAB — COMPREHENSIVE METABOLIC PANEL
ALK PHOS: 43 U/L (ref 38–126)
ALT: 11 U/L — AB (ref 14–54)
AST: 15 U/L (ref 15–41)
Albumin: 4.1 g/dL (ref 3.5–5.0)
Anion gap: 7 (ref 5–15)
BILIRUBIN TOTAL: 0.2 mg/dL — AB (ref 0.3–1.2)
BUN: 10 mg/dL (ref 6–20)
CALCIUM: 9.1 mg/dL (ref 8.9–10.3)
CO2: 24 mmol/L (ref 22–32)
CREATININE: 0.68 mg/dL (ref 0.44–1.00)
Chloride: 105 mmol/L (ref 101–111)
GFR calc Af Amer: >60 mL/min (ref >60)
Glucose, Bld: 123 mg/dL — ABNORMAL HIGH (ref 65–99)
Potassium: 3.6 mmol/L (ref 3.5–5.1)
Sodium: 136 mmol/L (ref 135–145)
TOTAL PROTEIN: 7.5 g/dL (ref 6.5–8.1)

## 2016-11-22 LAB — URINALYSIS, ROUTINE W REFLEX MICROSCOPIC
BILIRUBIN URINE: NEGATIVE
GLUCOSE, UA: NEGATIVE mg/dL
Hgb urine dipstick: NEGATIVE
Ketones, ur: NEGATIVE mg/dL
NITRITE: NEGATIVE
PH: 6 (ref 5.0–8.0)
Protein, ur: NEGATIVE mg/dL
SPECIFIC GRAVITY, URINE: 1.024 (ref 1.005–1.030)

## 2016-11-22 LAB — LIPASE, BLOOD: Lipase: 23 U/L (ref 11–51)

## 2016-11-22 NOTE — ED Triage Notes (Signed)
Pt reports R flank pain/ RLQ abd pain for the past 4 days accompanied by dysuria and vaginal discharge.

## 2016-11-23 ENCOUNTER — Encounter (HOSPITAL_COMMUNITY): Payer: Self-pay | Admitting: *Deleted

## 2016-11-23 ENCOUNTER — Inpatient Hospital Stay (HOSPITAL_COMMUNITY)
Admission: AD | Admit: 2016-11-23 | Discharge: 2016-11-23 | Disposition: A | Discharge status: Home / Self Care | Payer: Medicaid Other | Source: Ambulatory Visit | Attending: Family Medicine | Admitting: Family Medicine

## 2016-11-23 DIAGNOSIS — N760 Acute vaginitis: Secondary | ICD-10-CM | POA: Insufficient documentation

## 2016-11-23 DIAGNOSIS — B9689 Other specified bacterial agents as the cause of diseases classified elsewhere: Secondary | ICD-10-CM | POA: Insufficient documentation

## 2016-11-23 DIAGNOSIS — R109 Unspecified abdominal pain: Secondary | ICD-10-CM | POA: Insufficient documentation

## 2016-11-23 LAB — URINALYSIS, ROUTINE W REFLEX MICROSCOPIC
Bacteria, UA: NONE SEEN
Bilirubin Urine: NEGATIVE
GLUCOSE, UA: NEGATIVE mg/dL
Hgb urine dipstick: NEGATIVE
Ketones, ur: NEGATIVE mg/dL
Nitrite: NEGATIVE
PH: 6 (ref 5.0–8.0)
PROTEIN: NEGATIVE mg/dL
SPECIFIC GRAVITY, URINE: 1.014 (ref 1.005–1.030)

## 2016-11-23 LAB — CBC
HEMATOCRIT: 35.8 % — AB (ref 36.0–46.0)
HEMOGLOBIN: 12.8 g/dL (ref 12.0–15.0)
MCH: 27.2 pg (ref 26.0–34.0)
MCHC: 35.8 g/dL (ref 30.0–36.0)
MCV: 76.2 fL — ABNORMAL LOW (ref 78.0–100.0)
Platelets: 276 10*3/uL (ref 150–400)
RBC: 4.7 MIL/uL (ref 3.87–5.11)
RDW: 13.9 % (ref 11.5–15.5)
WBC: 12.9 10*3/uL — ABNORMAL HIGH (ref 4.0–10.5)

## 2016-11-23 LAB — WET PREP, GENITAL
SPERM: NONE SEEN
TRICH WET PREP: NONE SEEN
Yeast Wet Prep HPF POC: NONE SEEN

## 2016-11-23 MED ORDER — METRONIDAZOLE 500 MG PO TABS: 500.0000 mg | ORAL_TABLET | Freq: Two times a day (BID) | ORAL | 0 refills | Status: DC

## 2016-11-23 NOTE — MAU Note (Addendum)
Pt reports pain since the weekend. The pain started on her right side, but is now on her Left. Pt reports pain/buring with urination since last week. Pt also states that she is having a clear discharge. Pt LMP Feb 8. Pt checked into [facility] and waited for 7 hours,had her blood drawn and labs sent,  but was not seen.

## 2016-11-23 NOTE — Discharge Instructions (Signed)

## 2016-11-23 NOTE — MAU Provider Note (Signed)
  History   Patient Julia Owens is a 31 year old [phone] here with complaints of pain with urination since last week and clear discharge since Friday.   CSN: [id number]  Arrival date and time: 11/23/16 2033   First Provider Initiated Contact with Patient 11/23/16 2158      Chief Complaint  Patient presents with  . Abdominal Pain  . Dysuria   HPI  OB History    Gravida Para Term Preterm AB Living   4 1 1  0 3 1   SAB TAB Ectopic Multiple Live Births   1 2 0 0 1      Past Medical History:  Diagnosis Date  . Abscessed tooth    current  . Gestational diabetes   . Gestational diabetes   . Trichomonas vaginitis   . Vaginal Pap smear, abnormal     Past Surgical History:  Procedure Laterality Date  . CESAREAN SECTION N/A 09/25/2014   Procedure: CESAREAN SECTION;  Surgeon: Frederico Hamman, MD;  Location: [facility];  Service: Obstetrics;  Laterality: N/A;  . COLPOSCOPY    . DILATION AND EVACUATION      Family History  Problem Relation Age of Onset  . Diabetes Father     Social History  Substance Use Topics  . Smoking status: Never Smoker  . Smokeless tobacco: Never Used  . Alcohol use Yes     Comment: socially drinks    Allergies:  Allergies  Allergen Reactions  . Flagyl [Metronidazole] Hives    Prescriptions Prior to Admission  Medication Sig Dispense Refill Last Dose  . cephALEXin (KEFLEX) 500 MG capsule Take 1 capsule (500 mg total) by mouth 2 (two) times daily. (Patient not taking: Reported on 11/23/2016) 20 capsule 0 Completed Course at Unknown time    Review of Systems  Constitutional: Negative.   HENT: Negative.   Eyes: Negative.   Respiratory: Negative.   Cardiovascular: Negative.   Gastrointestinal: Positive for abdominal pain.  Genitourinary: Positive for dysuria. Negative for dyspareunia.  Musculoskeletal: Negative.   Neurological: Negative.   Hematological: Negative.   Psychiatric/Behavioral: Negative.    Physical Exam   Blood  pressure 127/84, pulse 87, temperature 98.3 F (36.8 C), temperature source Oral, resp. rate 16, last menstrual period 11/08/2016, unknown if currently breastfeeding.  Physical Exam  Constitutional: She appears well-developed.  HENT:  Head: Normocephalic.  Eyes: Pupils are equal, round, and reactive to light.  Neck: Normal range of motion.  Respiratory: Effort normal.  GI: Soft. She exhibits no distension and no mass. There is no tenderness. There is no rebound and no guarding.  Genitourinary:  Genitourinary Comments: NEFG; vaginal walls pink with no lesions. No discharge in the vaginal vault. No CMT, no suprapubic tenderness.     MAU Course  Procedures  MDM -bimanual -wet prep: positive for clue -UC pending -GC CT pending  Assessment and Plan   1. Abdominal pain, unspecified abdominal location   2. Bacterial vaginosis    2. Patient stable for discharge; she does not want flagyl at this time as her vaginal discharge is not that irritatating to her.  3. Encouraged patient to establish regular gyn care and told her that we would follow up with her if her urine culture or GC CT came back positive.   Mervyn Skeeters [place] CNM 11/23/2016, 11:08 PM

## 2016-11-24 LAB — GC/CHLAMYDIA PROBE AMP (~~LOC~~) NOT AT ARMC
Chlamydia: NEGATIVE
NEISSERIA GONORRHEA: NEGATIVE

## 2016-11-25 LAB — URINE CULTURE

## 2019-08-02 ENCOUNTER — Encounter (HOSPITAL_COMMUNITY): Payer: Self-pay | Admitting: Emergency Medicine

## 2019-08-02 ENCOUNTER — Emergency Department (HOSPITAL_COMMUNITY)
Admission: EM | Admit: 2019-08-02 | Discharge: 2019-08-02 | Disposition: A | Discharge status: Home / Self Care | Payer: Self-pay | Attending: Emergency Medicine | Admitting: Emergency Medicine

## 2019-08-02 DIAGNOSIS — R0602 Shortness of breath: Secondary | ICD-10-CM | POA: Insufficient documentation

## 2019-08-02 DIAGNOSIS — F419 Anxiety disorder, unspecified: Secondary | ICD-10-CM | POA: Insufficient documentation

## 2019-08-02 DIAGNOSIS — R110 Nausea: Secondary | ICD-10-CM | POA: Insufficient documentation

## 2019-08-02 DIAGNOSIS — R208 Other disturbances of skin sensation: Secondary | ICD-10-CM | POA: Insufficient documentation

## 2019-08-02 LAB — CBG MONITORING, ED: Glucose-Capillary: 101 mg/dL — ABNORMAL HIGH (ref 70–99)

## 2019-08-02 MED ORDER — NYSTATIN 100000 UNIT/ML MT SUSP: 500000.0000 [IU] | Freq: Four times a day (QID) | OROMUCOSAL | 0 refills | Status: AC

## 2019-08-02 NOTE — ED Provider Notes (Signed)
[facility] DEPT Provider Note   CSN: [id number] Arrival date & time: 08/02/19  1503     History   Chief Complaint Chief Complaint  Patient presents with  . Anxiety    HPI Tyannah Owens is a 33 y.o. female presenting for evaluation of tongue discomfort.  Patient states she started to develop an abnormal sensation of her tongue 2 days ago.  Patient describes it as feeling tingly and burning.  It is intermittent.  She reports improvement when she gargles salt water.  Patient was given Claritin today to try and help with her symptoms, and soon after she had acute onset shortness of breath, nausea, and feeling anxious.  She has taken Claritin before without side effects.  This has resolved, however her mouth symptoms have continued.  She denies dental pain.  She denies fevers, chills, sore throat, nasal congestion, cough, or heartburn.  Patient is concerned that the tongue sensation is due to an allergic reaction to shellfish that she ate 5 days ago.  She was having no symptoms until 2 days ago.  She has eaten shellfish before without reaction.     HPI  Past Medical History:  Diagnosis Date  . Abscessed tooth    current  . Gestational diabetes   . Gestational diabetes   . Trichomonas vaginitis   . Vaginal Pap smear, abnormal     Patient Active Problem List   Diagnosis Date Noted  . PCOS (polycystic ovarian syndrome) 07/08/2015  . Fibroids 07/08/2015    Past Surgical History:  Procedure Laterality Date  . CESAREAN SECTION N/A 09/25/2014   Procedure: CESAREAN SECTION;  Surgeon: Frederico Hamman, MD;  Location: [facility];  Service: Obstetrics;  Laterality: N/A;  . COLPOSCOPY    . DILATION AND EVACUATION       OB History    Gravida  4   Para  1   Term  1   Preterm  0   AB  3   Living  1     SAB  1   TAB  2   Ectopic  0   Multiple  0   Live Births  1            Home Medications    Prior to Admission medications    Medication Sig Start Date End Date Taking? Authorizing Provider  nystatin (MYCOSTATIN) 100000 UNIT/ML suspension Take 5 mLs (500,000 Units total) by mouth 4 (four) times daily for 7 days. 08/02/19 08/09/19  Miela Desjardin, PA-C  diphenhydrAMINE (BENADRYL) 25 MG tablet Take 1 tablet (25 mg total) by mouth every 6 (six) hours. Patient not taking: Reported on 03/11/2016 12/06/15 03/11/16  Marella Chimes, PA-C  loratadine (CLARITIN) 10 MG tablet Take 1 tablet (10 mg total) by mouth daily. Patient not taking: Reported on 03/11/2016 12/06/15 03/11/16  Marella Chimes, PA-C    Family History Family History  Problem Relation Age of Onset  . Diabetes Father     Social History Social History   Tobacco Use  . Smoking status: Never Smoker  . Smokeless tobacco: Never Used  Substance Use Topics  . Alcohol use: Yes    Comment: socially drinks  . Drug use: No     Allergies   Flagyl [metronidazole]   Review of Systems Review of Systems  Constitutional: Negative for fever.  HENT:       Tingling and burning of tongue   Respiratory: Negative for cough.      Physical Exam  Updated Vital Signs BP (!) 152/96   Pulse 90   Temp 98.4 F (36.9 C) (Oral)   Resp 18   SpO2 99%   Physical Exam Vitals signs and nursing note reviewed.  Constitutional:      General: She is not in acute distress.    Appearance: She is well-developed.     Comments: Resting comfortably in the bed in no acute distress  HENT:     Head: Normocephalic and atraumatic.     Mouth/Throat:     Comments: White film noted on the tongue, not removed with swallowing Eyes:     Extraocular Movements: Extraocular movements intact.     Conjunctiva/sclera: Conjunctivae normal.     Pupils: Pupils are equal, round, and reactive to light.  Neck:     Musculoskeletal: Normal range of motion and neck supple.  Cardiovascular:     Rate and Rhythm: Normal rate and regular rhythm.     Pulses: Normal pulses.  Pulmonary:      Effort: Pulmonary effort is normal. No respiratory distress.     Breath sounds: Normal breath sounds. No wheezing.     Comments: Speaking in full sentences. Clear lung sounds in all fields. No signs of sob or respiratory distress.  Abdominal:     General: There is no distension.     Palpations: Abdomen is soft. There is no mass.     Tenderness: There is no abdominal tenderness. There is no guarding or rebound.  Musculoskeletal: Normal range of motion.     Right lower leg: No edema.     Left lower leg: No edema.  Skin:    General: Skin is warm and dry.     Capillary Refill: Capillary refill takes less than 2 seconds.  Neurological:     Mental Status: She is alert and oriented to person, place, and time.      ED Treatments / Results  Labs (all labs ordered are listed, but only abnormal results are displayed) Labs Reviewed  CBG MONITORING, ED - Abnormal; Notable for the following components:      Result Value   Glucose-Capillary 101 (*)    All other components within normal limits    EKG None  Radiology No results found.  Procedures Procedures (including critical care time)  Medications Ordered in ED Medications - No data to display   Initial Impression / Assessment and Plan / ED Course  I have reviewed the triage vital signs and the nursing notes.  Pertinent labs & imaging results that were available during my care of the patient were reviewed by me and considered in my medical decision making (see chart for details).        Pt presenting for evaluation of abnormal sensation of the tongue.  Physical exam reassuring, she appears nontoxic.  Patient also had an episode of shortness of breath, nausea, anxiety, this is since resolved.  No longer feeling or appearing short of breath.  Patient was concerned about possible allergic reaction, however her symptoms began several days after patient supposedly allergen exposure, I have a low suspicion for allergic reaction.   As patient has a white film noted on her tongue, consider thrush, although patient without obvious risk factors.  Will check CBG to ensure no hyperglycemia.  Will treat with nystatin.  If not improved, patient to follow-up with primary care  CBG without concerning hyperglycemia at 101.  Heart rate improved without intervention.  At this time, patient appears safe for discharge.  Return precautions  given.  Patient states she understands and agrees to plan.  Final Clinical Impressions(s) / ED Diagnoses   Final diagnoses:  Burning sensation of mouth    ED Discharge Orders         Ordered    nystatin (MYCOSTATIN) 100000 UNIT/ML suspension  4 times daily     08/02/19 1950           Franchot Heidelberg, PA-C 08/02/19 2206    Lucrezia Starch, MD 08/04/19 0020

## 2019-08-02 NOTE — ED Triage Notes (Signed)
Per EMS-patient thought she was having an allergic reaction, numbness to tongue and lips-took a Claritin on the advice of a friend-no hives or swelling--states she has been more anxious due to "life issues"

## 2019-08-02 NOTE — Discharge Instructions (Signed)
Use nystatin suspension 4 times a day for the next week. You may also gargle with saltwater as needed for symptom control. Follow-up with your primary care doctor for reevaluation of symptoms. Return to the emergency room with any new, worsening, concerning symptoms

## 2019-10-31 DIAGNOSIS — Z0389 Encounter for observation for other suspected diseases and conditions ruled out: Secondary | ICD-10-CM | POA: Diagnosis not present

## 2019-12-17 DIAGNOSIS — B029 Zoster without complications: Secondary | ICD-10-CM | POA: Diagnosis not present

## 2019-12-20 DIAGNOSIS — Z86018 Personal history of other benign neoplasm: Secondary | ICD-10-CM | POA: Diagnosis not present

## 2019-12-20 DIAGNOSIS — Z3041 Encounter for surveillance of contraceptive pills: Secondary | ICD-10-CM | POA: Diagnosis not present

## 2019-12-20 DIAGNOSIS — Z113 Encounter for screening for infections with a predominantly sexual mode of transmission: Secondary | ICD-10-CM | POA: Diagnosis not present

## 2019-12-20 DIAGNOSIS — Z01411 Encounter for gynecological examination (general) (routine) with abnormal findings: Secondary | ICD-10-CM | POA: Diagnosis not present

## 2019-12-20 DIAGNOSIS — R102 Pelvic and perineal pain: Secondary | ICD-10-CM | POA: Diagnosis not present

## 2020-06-15 DIAGNOSIS — B029 Zoster without complications: Secondary | ICD-10-CM | POA: Diagnosis not present

## 2020-08-05 DIAGNOSIS — Z0000 Encounter for general adult medical examination without abnormal findings: Secondary | ICD-10-CM | POA: Diagnosis not present

## 2020-08-05 DIAGNOSIS — Z8632 Personal history of gestational diabetes: Secondary | ICD-10-CM | POA: Diagnosis not present

## 2020-08-05 DIAGNOSIS — Z23 Encounter for immunization: Secondary | ICD-10-CM | POA: Diagnosis not present

## 2020-08-05 DIAGNOSIS — B360 Pityriasis versicolor: Secondary | ICD-10-CM | POA: Diagnosis not present

## 2020-09-25 ENCOUNTER — Encounter: Payer: Self-pay | Admitting: Podiatry

## 2020-09-25 ENCOUNTER — Ambulatory Visit (INDEPENDENT_AMBULATORY_CARE_PROVIDER_SITE_OTHER): Payer: BC Managed Care – PPO

## 2020-09-25 ENCOUNTER — Ambulatory Visit (INDEPENDENT_AMBULATORY_CARE_PROVIDER_SITE_OTHER): Payer: BC Managed Care – PPO | Admitting: Podiatry

## 2020-09-25 ENCOUNTER — Other Ambulatory Visit: Payer: Self-pay

## 2020-09-25 DIAGNOSIS — M79672 Pain in left foot: Secondary | ICD-10-CM

## 2020-09-25 DIAGNOSIS — Z8632 Personal history of gestational diabetes: Secondary | ICD-10-CM | POA: Insufficient documentation

## 2020-09-25 DIAGNOSIS — M79671 Pain in right foot: Secondary | ICD-10-CM | POA: Diagnosis not present

## 2020-09-25 DIAGNOSIS — M21611 Bunion of right foot: Secondary | ICD-10-CM | POA: Diagnosis not present

## 2020-09-25 DIAGNOSIS — M21619 Bunion of unspecified foot: Secondary | ICD-10-CM | POA: Diagnosis not present

## 2020-09-25 DIAGNOSIS — L600 Ingrowing nail: Secondary | ICD-10-CM

## 2020-09-25 DIAGNOSIS — M21612 Bunion of left foot: Secondary | ICD-10-CM | POA: Diagnosis not present

## 2020-09-25 NOTE — Patient Instructions (Signed)
Place 1/4 cup of epsom salts in a quart of warm tap water.  Submerge your foot or feet in the solution and soak for 20 minutes.  This soak should be done twice a day.  Next, remove your foot or feet from solution, blot dry the affected area. Apply ointment and cover if instructed by your doctor.   IF YOUR SKIN BECOMES IRRITATED WHILE USING THESE INSTRUCTIONS, IT IS OKAY TO SWITCH TO  WHITE VINEGAR AND WATER.  As another alternative soak, you may use antibacterial soap and water.  Monitor for any signs/symptoms of infection. Call the office immediately if any occur or go directly to the emergency room. Call with any questions/concerns.  Ingrown Toenail An ingrown toenail occurs when the corner or sides of a toenail grow into the surrounding skin. This causes discomfort and pain. The big toe is most commonly affected, but any of the toes can be affected. If an ingrown toenail is not treated, it can become infected. What are the causes? This condition may be caused by:  Wearing shoes that are too small or tight.  An injury, such as stubbing your toe or having your toe stepped on.  Improper cutting or care of your toenails.  Having nail or foot abnormalities that were present from birth (congenital abnormalities), such as having a nail that is too big for your toe. What increases the risk? The following factors may make you more likely to develop ingrown toenails:  Age. Nails tend to get thicker with age, so ingrown nails are more common among older people.  Cutting your toenails incorrectly, such as cutting them very short or cutting them unevenly. An ingrown toenail is more likely to get infected if you have:  Diabetes.  Blood flow (circulation) problems. What are the signs or symptoms? Symptoms of an ingrown toenail may include:  Pain, soreness, or tenderness.  Redness.  Swelling.  Hardening of the skin that surrounds the toenail. Signs that an ingrown toenail may be infected  include:  Fluid or pus.  Symptoms that get worse instead of better. How is this diagnosed? An ingrown toenail may be diagnosed based on your medical history, your symptoms, and a physical exam. If you have fluid or blood coming from your toenail, a sample may be collected to test for the specific type of bacteria that is causing the infection. How is this treated? Treatment depends on how severe your ingrown toenail is. You may be able to care for your toenail at home.  If you have an infection, you may be prescribed antibiotic medicines.  If you have fluid or pus draining from your toenail, your health care provider may drain it.  If you have trouble walking, you may be given crutches to use.  If you have a severe or infected ingrown toenail, you may need a procedure to remove part or all of the nail. Follow these instructions at home: Foot care   Do not pick at your toenail or try to remove it yourself.  Soak your foot in warm, soapy water. Do this for 20 minutes, 3 times a day, or as often as told by your health care provider. This helps to keep your toe clean and keep your skin soft.  Wear shoes that fit well and are not too tight. Your health care provider may recommend that you wear open-toed shoes while you heal.  Trim your toenails regularly and carefully. Cut your toenails straight across to prevent injury to the skin at the   corners of the toenail. Do not cut your nails in a curved shape.  Keep your feet clean and dry to help prevent infection. Medicines  Take over-the-counter and prescription medicines only as told by your health care provider.  If you were prescribed an antibiotic, take it as told by your health care provider. Do not stop taking the antibiotic even if you start to feel better. Activity  Return to your normal activities as told by your health care provider. Ask your health care provider what activities are safe for you.  Avoid activities that cause  pain. General instructions  If your health care provider told you to use crutches to help you move around, use them as instructed.  Keep all follow-up visits as told by your health care provider. This is important. Contact a health care provider if:  You have more redness, swelling, pain, or other symptoms that do not improve with treatment.  You have fluid, blood, or pus coming from your toenail. Get help right away if:  You have a red streak on your skin that starts at your foot and spreads up your leg.  You have a fever. Summary  An ingrown toenail occurs when the corner or sides of a toenail grow into the surrounding skin. This causes discomfort and pain. The big toe is most commonly affected, but any of the toes can be affected.  If an ingrown toenail is not treated, it can become infected.  Fluid or pus draining from your toenail is a sign of infection. Your health care provider may need to drain it. You may be given antibiotics to treat the infection.  Trimming your toenails regularly and properly can help you prevent an ingrown toenail. This information is not intended to replace advice given to you by your health care provider. Make sure you discuss any questions you have with your health care provider. Document Revised: 01/05/2019 Document Reviewed: 06/01/2017 Elsevier Patient Education  2020 [employer] Inc.  

## 2020-09-25 NOTE — Progress Notes (Signed)
Subjective:   Patient ID: Julia Owens, female   DOB: 34 y.o.   MRN: [id number]   HPI Patient states that she has chronic ingrown toenails of her second nail of both feet and also has chronic bunion deformities which it sore neck it hard to wear shoe gear comfortably.  Patient states this is been an ongoing issue for her and the nails are quite tender when she tries to trim them.  Patient does not smoke likes to be active   Review of Systems  All other systems reviewed and are negative.       Objective:  Physical Exam Vitals and nursing note reviewed.  Constitutional:      Appearance: She is well-developed and well-nourished.  Cardiovascular:     Pulses: Intact distal pulses.  Pulmonary:     Effort: Pulmonary effort is normal.  Musculoskeletal:        General: Normal range of motion.  Skin:    General: Skin is warm.  Neurological:     Mental Status: She is alert.     Neurovascular status found to be intact muscle strength adequate range of motion adequate.  Patient does have significant structural bunion deformity left over right with redness and pain around the side and digital deformities with elevation of the second toe with thickening of the nailbed secondary to pressure with incurvation of the medial borders.  Patient did not have good digital perfusion well oriented x3     Assessment:  Ingrown toenail deformity second bilateral with pain along with structural bunion deformity bilateral     Plan:  H&P reviewed all conditions and discussed bunions with x-rays.  Today I went ahead and will get a focus on these nails and I recommended correction of the borders I did explain that ultimately she may lose the entire second nail and I did discuss this versus the borders and she is opted for the borders at this time understanding total nail removal may be necessary someday.  I went ahead and I infiltrated each second toe 60 mg like Marcaine mixture patient did sign consent form  understanding risk and I then removed medial borders bilateral second nail exposed matrix applied phenol 3 applications 30 seconds followed by alcohol lavage sterile dressing and instructed on soaks and leaving dressings on 24 hours but taking them off earlier if any throbbing were to occur.  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

## 2020-09-30 DIAGNOSIS — U071 COVID-19: Secondary | ICD-10-CM | POA: Diagnosis not present

## 2020-09-30 DIAGNOSIS — Z20822 Contact with and (suspected) exposure to covid-19: Secondary | ICD-10-CM | POA: Diagnosis not present

## 2020-10-02 DIAGNOSIS — U071 COVID-19: Secondary | ICD-10-CM | POA: Diagnosis not present

## 2020-10-02 DIAGNOSIS — R059 Cough, unspecified: Secondary | ICD-10-CM | POA: Diagnosis not present

## 2020-10-06 ENCOUNTER — Ambulatory Visit: Payer: Self-pay | Admitting: Podiatry

## 2020-10-16 ENCOUNTER — Ambulatory Visit (INDEPENDENT_AMBULATORY_CARE_PROVIDER_SITE_OTHER): Payer: BC Managed Care – PPO | Admitting: Podiatry

## 2020-10-16 ENCOUNTER — Other Ambulatory Visit: Payer: Self-pay

## 2020-10-16 ENCOUNTER — Encounter: Payer: Self-pay | Admitting: Podiatry

## 2020-10-16 DIAGNOSIS — M21619 Bunion of unspecified foot: Secondary | ICD-10-CM

## 2020-10-16 DIAGNOSIS — L600 Ingrowing nail: Secondary | ICD-10-CM | POA: Diagnosis not present

## 2020-10-16 NOTE — Progress Notes (Signed)
Subjective:   Patient ID: Julia Owens, female   DOB: 35 y.o.   MRN: [id number]   HPI Patient presents concerned because she is still having some discomfort in her second digit bilateral and also has questions concerning bunion deformity   ROS      Objective:  Physical Exam  H&P with crusted tissue medial side digit to bilateral that is not currently draining mildly tender with elevation of the toes and pressure against the big toe along with structural bunion     Assessment:  More of the compression against the second digits which is probably creating some irritation with no indications currently of infection with structural bunion deformity bilateral     Plan:  H&P conditions reviewed and at this point I have recommended wider shoes and I applied cushions to the second toe along with dressings and bandaging and I did discuss bunion correction and we will get more information for her.  Patient will be seen back ultimately will require bunion correction but we will make that decision based on response

## 2020-11-04 DIAGNOSIS — L853 Xerosis cutis: Secondary | ICD-10-CM | POA: Diagnosis not present

## 2020-11-04 DIAGNOSIS — L304 Erythema intertrigo: Secondary | ICD-10-CM | POA: Diagnosis not present

## 2020-11-04 DIAGNOSIS — L811 Chloasma: Secondary | ICD-10-CM | POA: Diagnosis not present

## 2020-11-04 DIAGNOSIS — L810 Postinflammatory hyperpigmentation: Secondary | ICD-10-CM | POA: Diagnosis not present

## 2020-11-19 ENCOUNTER — Other Ambulatory Visit: Payer: Self-pay | Admitting: Podiatry

## 2020-11-19 DIAGNOSIS — M21619 Bunion of unspecified foot: Secondary | ICD-10-CM

## 2021-01-02 ENCOUNTER — Encounter: Payer: Self-pay | Admitting: Podiatry

## 2021-01-02 ENCOUNTER — Other Ambulatory Visit: Payer: Self-pay

## 2021-01-02 ENCOUNTER — Ambulatory Visit: Payer: BC Managed Care – PPO | Admitting: Podiatry

## 2021-01-02 DIAGNOSIS — M79671 Pain in right foot: Secondary | ICD-10-CM | POA: Diagnosis not present

## 2021-01-02 DIAGNOSIS — L600 Ingrowing nail: Secondary | ICD-10-CM | POA: Diagnosis not present

## 2021-01-02 DIAGNOSIS — M79672 Pain in left foot: Secondary | ICD-10-CM

## 2021-01-02 NOTE — Progress Notes (Signed)
Subjective:   Patient ID: Julia Owens, female   DOB: 35 y.o.   MRN: [id number]   HPI Patient presents concerned about some discoloration around the second digit bilateral and some crusted tissue on the inside   ROS      Objective:  Physical Exam  Neurovascular status intact with patient having crusted tissue from previous ingrown toenail removal of the medial side second digit bilateral with thickness of the nailbed which is more due to deformity with some discoloration of the inner phalangeal joint localized have to well-healing nail sites do not see pathology with patient having mild inflammation and just generalized poor nail structure     Assessment:  Reviewed above     Plan:  I discussed condition I do not recommend treatment except for pedicures and utilization of soaks.  They should eventually recauterized but she does have pigment change which makes it difficult

## 2021-01-22 DIAGNOSIS — N949 Unspecified condition associated with female genital organs and menstrual cycle: Secondary | ICD-10-CM | POA: Diagnosis not present

## 2021-01-22 DIAGNOSIS — Z01419 Encounter for gynecological examination (general) (routine) without abnormal findings: Secondary | ICD-10-CM | POA: Diagnosis not present

## 2021-01-22 DIAGNOSIS — N920 Excessive and frequent menstruation with regular cycle: Secondary | ICD-10-CM | POA: Diagnosis not present

## 2021-01-22 DIAGNOSIS — Z86018 Personal history of other benign neoplasm: Secondary | ICD-10-CM | POA: Diagnosis not present

## 2021-01-22 DIAGNOSIS — Z113 Encounter for screening for infections with a predominantly sexual mode of transmission: Secondary | ICD-10-CM | POA: Diagnosis not present

## 2021-02-16 DIAGNOSIS — N949 Unspecified condition associated with female genital organs and menstrual cycle: Secondary | ICD-10-CM | POA: Diagnosis not present

## 2021-03-19 DIAGNOSIS — N711 Chronic inflammatory disease of uterus: Secondary | ICD-10-CM | POA: Diagnosis not present

## 2021-03-19 DIAGNOSIS — N920 Excessive and frequent menstruation with regular cycle: Secondary | ICD-10-CM | POA: Diagnosis not present

## 2021-03-19 DIAGNOSIS — Z3202 Encounter for pregnancy test, result negative: Secondary | ICD-10-CM | POA: Diagnosis not present

## 2021-06-16 ENCOUNTER — Ambulatory Visit (INDEPENDENT_AMBULATORY_CARE_PROVIDER_SITE_OTHER): Payer: BC Managed Care – PPO | Admitting: Podiatry

## 2021-06-16 ENCOUNTER — Other Ambulatory Visit: Payer: Self-pay

## 2021-06-16 DIAGNOSIS — L600 Ingrowing nail: Secondary | ICD-10-CM

## 2021-06-16 DIAGNOSIS — L603 Nail dystrophy: Secondary | ICD-10-CM | POA: Diagnosis not present

## 2021-06-16 DIAGNOSIS — B351 Tinea unguium: Secondary | ICD-10-CM

## 2021-06-16 NOTE — Patient Instructions (Signed)
I have ordered a medication for you that will come from [facility] in . They should be calling you to verify insurance and will mail the medication to you. If you live close by then you can go by their pharmacy to pick up the medication. Their phone number is [phone]. If you do not hear from them in the next few days, please give us a call at [phone].   

## 2021-06-23 NOTE — Progress Notes (Signed)
Subjective: 36 year old female presents the office today for concerns of bilateral second toenails becoming thickened discolored and not growing properly after she had an ingrown toenail procedure performed.  Occasional discomfort but denies any swelling or redness or any drainage.  No recent treatment.  Objective: AAO x3, NAD DP/PT pulses palpable bilaterally, CRT less than 3 seconds Bilateral second digit toenails are hypertrophic, dystrophic with brown discoloration.  They are turned inward some as well.  There is no significant pain today and there is no edema, erythema, drainage or pus or any signs of infection. No pain with calf compression, swelling, warmth, erythema  Assessment: Bilateral second digit toenail onychodystrophy, onychomycosis  Plan: -All treatment options discussed with the patient including all alternatives, risks, complications.  -At this point we discussed different treatment options including medications as well as nail removal.  I discussed that removal of the nail is not a guarantee does not come back and better.  Organ to try to treat this conservatively to allow the nail to grow out.  I ordered a compound cream today through [facility] to help with nail fungus as well as the urea to help with damage. -Patient encouraged to call the office with any questions, concerns, change in symptoms.   Trula Slade DPM

## 2021-08-13 DIAGNOSIS — Z13220 Encounter for screening for lipoid disorders: Secondary | ICD-10-CM | POA: Diagnosis not present

## 2021-08-13 DIAGNOSIS — R7303 Prediabetes: Secondary | ICD-10-CM | POA: Diagnosis not present

## 2021-08-13 DIAGNOSIS — Z131 Encounter for screening for diabetes mellitus: Secondary | ICD-10-CM | POA: Diagnosis not present

## 2021-08-13 DIAGNOSIS — Z0000 Encounter for general adult medical examination without abnormal findings: Secondary | ICD-10-CM | POA: Diagnosis not present

## 2021-09-15 ENCOUNTER — Ambulatory Visit: Payer: BC Managed Care – PPO | Admitting: Podiatry

## 2021-09-15 ENCOUNTER — Other Ambulatory Visit: Payer: Self-pay

## 2021-09-15 DIAGNOSIS — B351 Tinea unguium: Secondary | ICD-10-CM

## 2021-09-17 NOTE — Progress Notes (Signed)
Subjective: 35 year old female  presents the office for follow-up evaluation of nail fungus to both of her second digit toenails.  She has been doing about the compound through [employer].  No pain to the nail no swelling redness or any drainage.  Objective: AAO x3, NAD DP/PT pulses palpable bilaterally, CRT less than 3 seconds Bilateral second toenails remain to be hypertrophic, dystrophic with brown discoloration but appear to be somewhat improved.  There is no pain associated tenderness swelling or redness or any drainage.  There is no open lesions.  No pain with calf compression, swelling, warmth, erythema  Assessment: Onychodystrophy, onychomycosis bilateral second digit toenails  Plan: -All treatment options discussed with the patient including all alternatives, risks, complications.  -We discussed other treatment options including nail removal but she has seen improvement with the topical medications were to continue this.  I will refill this for her as well.  Should there be any signs of infection or increased pain or any worsening will need to remove the nail.  She has some dry skin present around the nail and this is coming from the medication.  If she gets an irritation medication to hold off on using it for couple of days until it resolves. -Patient encouraged to call the office with any questions, concerns, change in symptoms.   Trula Slade DPM

## 2021-11-11 DIAGNOSIS — L304 Erythema intertrigo: Secondary | ICD-10-CM | POA: Diagnosis not present

## 2021-11-11 DIAGNOSIS — L081 Erythrasma: Secondary | ICD-10-CM | POA: Diagnosis not present

## 2021-11-11 DIAGNOSIS — L811 Chloasma: Secondary | ICD-10-CM | POA: Diagnosis not present

## 2021-12-29 ENCOUNTER — Other Ambulatory Visit: Payer: Self-pay | Admitting: Physician Assistant

## 2021-12-29 ENCOUNTER — Ambulatory Visit
Admission: RE | Admit: 2021-12-29 | Discharge: 2021-12-29 | Disposition: A | Discharge status: Home / Self Care | Payer: BC Managed Care – PPO | Source: Ambulatory Visit | Attending: Physician Assistant | Admitting: Physician Assistant

## 2021-12-29 DIAGNOSIS — R109 Unspecified abdominal pain: Secondary | ICD-10-CM | POA: Diagnosis not present

## 2021-12-29 DIAGNOSIS — K5900 Constipation, unspecified: Secondary | ICD-10-CM | POA: Diagnosis not present

## 2021-12-29 DIAGNOSIS — R1084 Generalized abdominal pain: Secondary | ICD-10-CM | POA: Diagnosis not present

## 2022-02-01 DIAGNOSIS — R067 Sneezing: Secondary | ICD-10-CM | POA: Diagnosis not present

## 2022-02-01 DIAGNOSIS — J302 Other seasonal allergic rhinitis: Secondary | ICD-10-CM | POA: Diagnosis not present

## 2022-02-01 DIAGNOSIS — J3489 Other specified disorders of nose and nasal sinuses: Secondary | ICD-10-CM | POA: Diagnosis not present

## 2022-03-16 ENCOUNTER — Ambulatory Visit: Payer: BC Managed Care – PPO | Admitting: Podiatry

## 2022-03-16 DIAGNOSIS — F418 Other specified anxiety disorders: Secondary | ICD-10-CM | POA: Diagnosis not present

## 2022-03-16 DIAGNOSIS — Z01419 Encounter for gynecological examination (general) (routine) without abnormal findings: Secondary | ICD-10-CM | POA: Diagnosis not present

## 2022-03-16 DIAGNOSIS — Z113 Encounter for screening for infections with a predominantly sexual mode of transmission: Secondary | ICD-10-CM | POA: Diagnosis not present

## 2022-03-16 DIAGNOSIS — Z3041 Encounter for surveillance of contraceptive pills: Secondary | ICD-10-CM | POA: Diagnosis not present

## 2022-03-16 DIAGNOSIS — R030 Elevated blood-pressure reading, without diagnosis of hypertension: Secondary | ICD-10-CM | POA: Diagnosis not present

## 2022-03-25 ENCOUNTER — Ambulatory Visit: Payer: BC Managed Care – PPO | Admitting: Podiatry

## 2022-03-25 ENCOUNTER — Encounter: Payer: Self-pay | Admitting: Podiatry

## 2022-03-25 DIAGNOSIS — B351 Tinea unguium: Secondary | ICD-10-CM | POA: Diagnosis not present

## 2022-03-25 DIAGNOSIS — L603 Nail dystrophy: Secondary | ICD-10-CM

## 2022-03-27 NOTE — Progress Notes (Signed)
Subjective: 36 year old female  presents the office for follow-up evaluation of nail fungus to both of her second digit toenails.  She states that about the same but may be a [place] bit better.  No swelling redness or any drainage.  She still using the topical compound cream through [facility].  No drainage or pus.  Objective: AAO x3, NAD DP/PT pulses palpable bilaterally, CRT less than 3 seconds Bilateral second toenails remain to be hypertrophic, dystrophic with brown discoloration but appear to be somewhat improved with the color is a bit more clear but still dystrophic.  There is no pain associated tenderness swelling or redness or any drainage.  There is no open lesions.  No pain with calf compression, swelling, warmth, erythema  Assessment: Onychodystrophy, onychomycosis bilateral second digit toenails  Plan: -All treatment options discussed with the patient including all alternatives, risks, complications.  -I sharply debrided the nails with any complications or bleeding as a courtesy.  She will continue the topical compound cream.  We discussed other treatment options with oral, different topicals as well as nail removal.  Trula Slade DPM

## 2022-04-11 IMAGING — CR DG ABDOMEN 1V
1 series · 1 of 1 positions shown · non-contrast
Comparison: None.

CLINICAL DATA: Generalized abdominal pain with constipation and
nausea

EXAM:
ABDOMEN - 1 VIEW

[t abdomen supine]
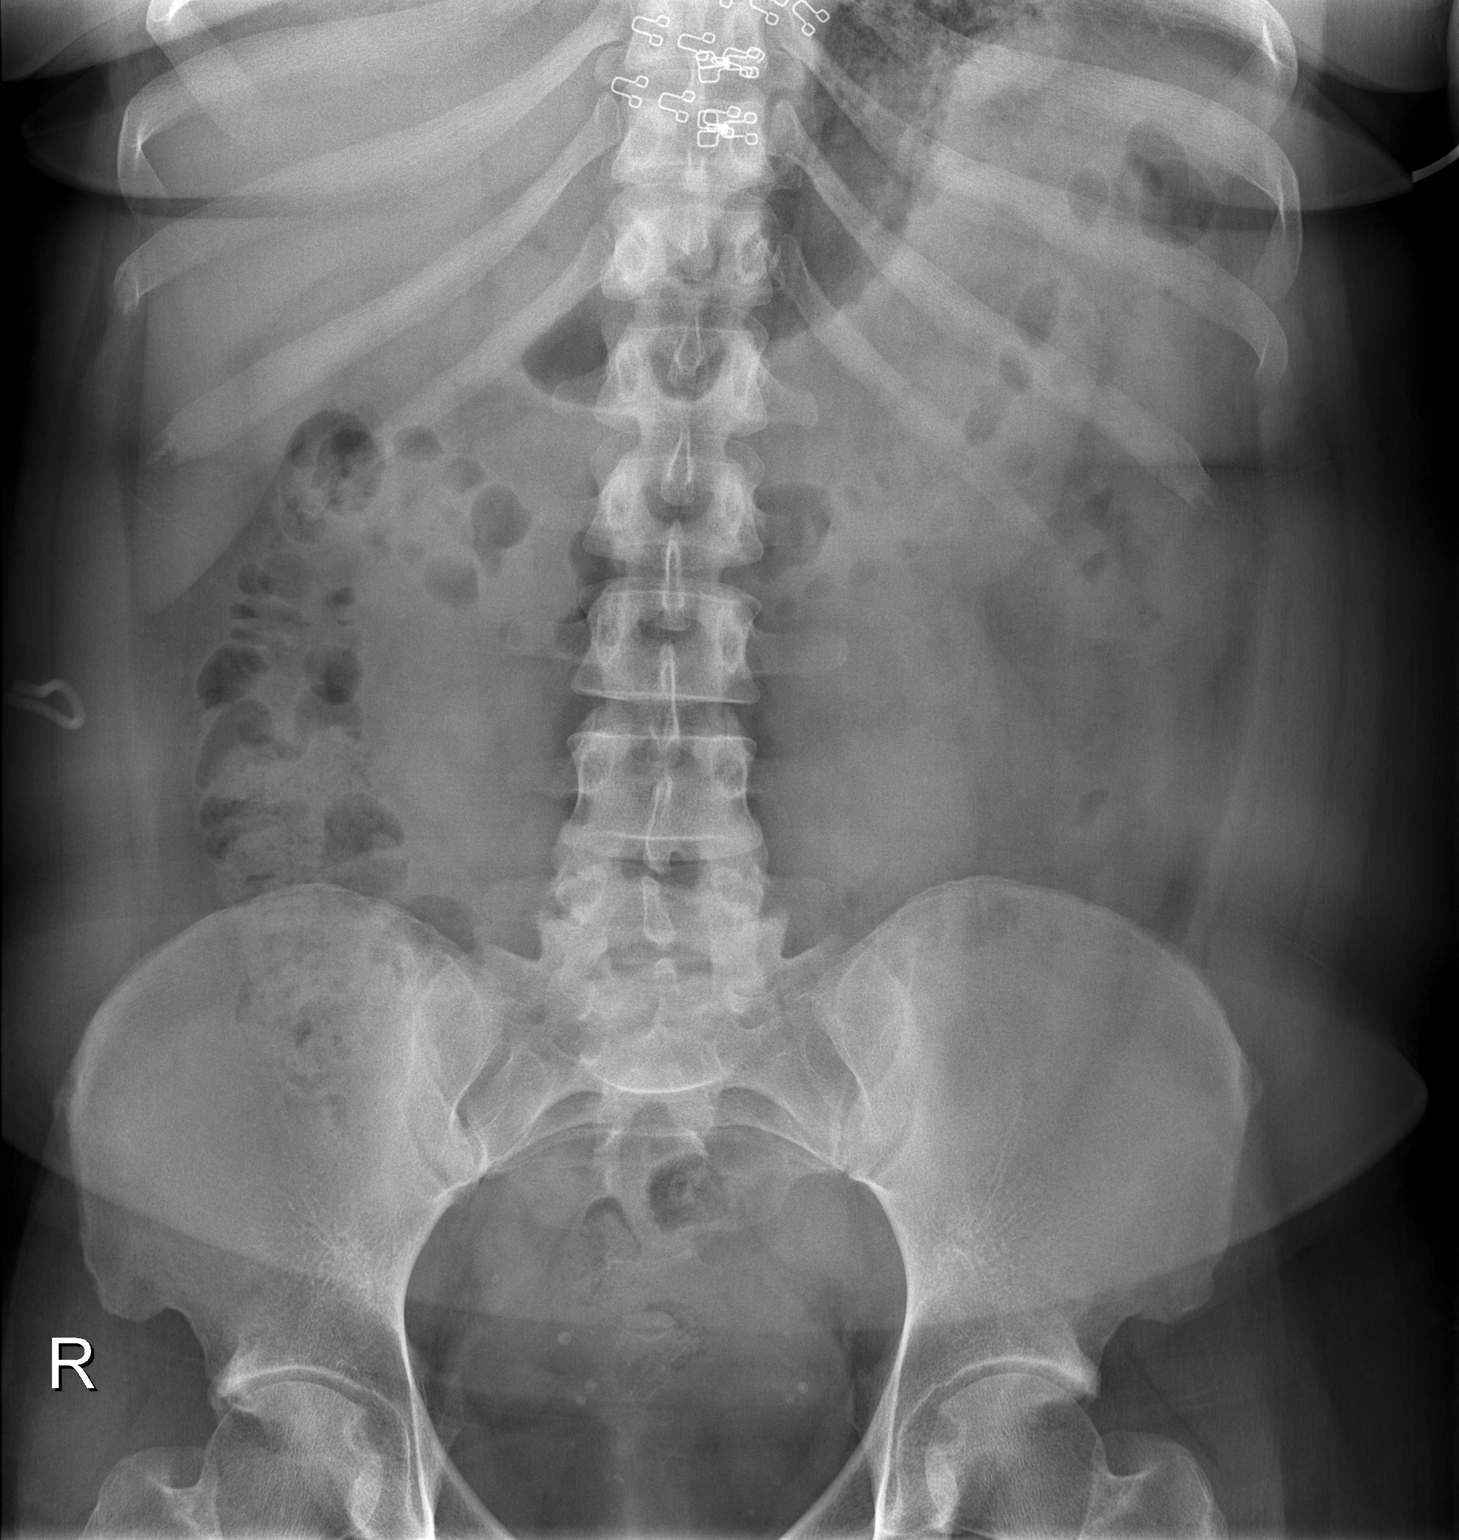

[1 of 1 positions shown; findings below may reference images not displayed]

FINDINGS: The bowel gas pattern is normal. No radio-opaque calculi or other
significant radiographic abnormality are seen.
IMPRESSION: 1. Nonobstructive bowel gas pattern.
2. Mild colonic stool burden.

## 2022-04-28 DIAGNOSIS — D259 Leiomyoma of uterus, unspecified: Secondary | ICD-10-CM | POA: Diagnosis not present

## 2022-04-28 DIAGNOSIS — F418 Other specified anxiety disorders: Secondary | ICD-10-CM | POA: Diagnosis not present

## 2022-04-28 DIAGNOSIS — N920 Excessive and frequent menstruation with regular cycle: Secondary | ICD-10-CM | POA: Diagnosis not present

## 2022-08-26 DIAGNOSIS — F418 Other specified anxiety disorders: Secondary | ICD-10-CM | POA: Diagnosis not present

## 2022-08-26 DIAGNOSIS — Z8632 Personal history of gestational diabetes: Secondary | ICD-10-CM | POA: Diagnosis not present

## 2022-08-26 DIAGNOSIS — Z13220 Encounter for screening for lipoid disorders: Secondary | ICD-10-CM | POA: Diagnosis not present

## 2022-08-26 DIAGNOSIS — Z0000 Encounter for general adult medical examination without abnormal findings: Secondary | ICD-10-CM | POA: Diagnosis not present

## 2022-08-26 DIAGNOSIS — D72829 Elevated white blood cell count, unspecified: Secondary | ICD-10-CM | POA: Diagnosis not present

## 2022-08-26 DIAGNOSIS — R7303 Prediabetes: Secondary | ICD-10-CM | POA: Diagnosis not present

## 2022-09-02 DIAGNOSIS — N898 Other specified noninflammatory disorders of vagina: Secondary | ICD-10-CM | POA: Diagnosis not present

## 2022-09-02 DIAGNOSIS — R102 Pelvic and perineal pain: Secondary | ICD-10-CM | POA: Diagnosis not present

## 2022-09-02 DIAGNOSIS — N920 Excessive and frequent menstruation with regular cycle: Secondary | ICD-10-CM | POA: Diagnosis not present

## 2022-09-02 DIAGNOSIS — F418 Other specified anxiety disorders: Secondary | ICD-10-CM | POA: Diagnosis not present

## 2022-09-02 DIAGNOSIS — R140 Abdominal distension (gaseous): Secondary | ICD-10-CM | POA: Diagnosis not present

## 2022-09-05 DIAGNOSIS — J101 Influenza due to other identified influenza virus with other respiratory manifestations: Secondary | ICD-10-CM | POA: Diagnosis not present

## 2022-09-05 DIAGNOSIS — E669 Obesity, unspecified: Secondary | ICD-10-CM | POA: Diagnosis not present

## 2022-09-05 DIAGNOSIS — Z6832 Body mass index (BMI) 32.0-32.9, adult: Secondary | ICD-10-CM | POA: Diagnosis not present

## 2022-09-14 DIAGNOSIS — R102 Pelvic and perineal pain: Secondary | ICD-10-CM | POA: Diagnosis not present

## 2022-09-14 DIAGNOSIS — D259 Leiomyoma of uterus, unspecified: Secondary | ICD-10-CM | POA: Diagnosis not present

## 2022-09-14 DIAGNOSIS — R140 Abdominal distension (gaseous): Secondary | ICD-10-CM | POA: Diagnosis not present

## 2022-09-23 ENCOUNTER — Encounter: Payer: Self-pay | Admitting: Podiatry

## 2022-09-23 ENCOUNTER — Ambulatory Visit: Payer: BC Managed Care – PPO | Admitting: Podiatry

## 2022-09-23 DIAGNOSIS — L603 Nail dystrophy: Secondary | ICD-10-CM | POA: Diagnosis not present

## 2022-09-23 DIAGNOSIS — B351 Tinea unguium: Secondary | ICD-10-CM

## 2022-09-29 NOTE — Progress Notes (Signed)
Subjective: Chief Complaint  Patient presents with   Foot Problem    Follow up for nail fungus on bilateral feet, treatment includes drops, applying morning and night. Patient is being positive about treatment     37 year old female  presents the office for follow-up evaluation of nail fungus to both of her second digit toenails.  She feels the medication is working.  She denies any swelling redness or drainage to the toenail sites.  No open lesions.  Objective: AAO x3, NAD DP/PT pulses palpable bilaterally, CRT less than 3 seconds Bilateral second toenails remain to be hypertrophic, dystrophic with brown discoloration but appear to be somewhat improved with the color is a bit more clear on the proximal nail fold but still dystrophic.  There is no pain associated tenderness swelling or redness or any drainage.  There is no open lesions.  No pain with calf compression, swelling, warmth, erythema  Assessment: Onychodystrophy, onychomycosis bilateral second digit toenails  Plan: -All treatment options discussed with the patient including all alternatives, risks, complications.  -As a courtesy I sharply debrided the nails with any complications or bleeding.  She feels the topical medication is helping so to continue with this.  -Improvement consider oral medication, nail removal if needed.  Trula Slade DPM

## 2022-11-12 DIAGNOSIS — R030 Elevated blood-pressure reading, without diagnosis of hypertension: Secondary | ICD-10-CM | POA: Diagnosis not present

## 2022-11-12 DIAGNOSIS — Z03818 Encounter for observation for suspected exposure to other biological agents ruled out: Secondary | ICD-10-CM | POA: Diagnosis not present

## 2022-11-12 DIAGNOSIS — M25552 Pain in left hip: Secondary | ICD-10-CM | POA: Diagnosis not present

## 2022-11-12 DIAGNOSIS — Z20822 Contact with and (suspected) exposure to covid-19: Secondary | ICD-10-CM | POA: Diagnosis not present

## 2022-11-12 DIAGNOSIS — Z1152 Encounter for screening for COVID-19: Secondary | ICD-10-CM | POA: Diagnosis not present

## 2022-11-16 DIAGNOSIS — L811 Chloasma: Secondary | ICD-10-CM | POA: Diagnosis not present

## 2022-11-16 DIAGNOSIS — D225 Melanocytic nevi of trunk: Secondary | ICD-10-CM | POA: Diagnosis not present

## 2022-11-16 DIAGNOSIS — L814 Other melanin hyperpigmentation: Secondary | ICD-10-CM | POA: Diagnosis not present

## 2022-11-16 DIAGNOSIS — L821 Other seborrheic keratosis: Secondary | ICD-10-CM | POA: Diagnosis not present

## 2022-11-22 ENCOUNTER — Other Ambulatory Visit: Payer: Self-pay | Admitting: Physician Assistant

## 2022-11-22 ENCOUNTER — Ambulatory Visit
Admission: RE | Admit: 2022-11-22 | Discharge: 2022-11-22 | Disposition: A | Discharge status: Home / Self Care | Payer: BC Managed Care – PPO | Source: Ambulatory Visit | Attending: Physician Assistant | Admitting: Physician Assistant

## 2022-11-22 DIAGNOSIS — R1032 Left lower quadrant pain: Secondary | ICD-10-CM

## 2022-11-22 DIAGNOSIS — K5900 Constipation, unspecified: Secondary | ICD-10-CM | POA: Diagnosis not present

## 2023-02-04 DIAGNOSIS — U071 COVID-19: Secondary | ICD-10-CM | POA: Diagnosis not present

## 2023-02-04 DIAGNOSIS — R062 Wheezing: Secondary | ICD-10-CM | POA: Diagnosis not present

## 2023-03-24 ENCOUNTER — Other Ambulatory Visit: Payer: Self-pay | Admitting: Podiatry

## 2023-03-24 ENCOUNTER — Ambulatory Visit: Payer: BC Managed Care – PPO | Admitting: Podiatry

## 2023-03-24 ENCOUNTER — Encounter: Payer: Self-pay | Admitting: Podiatry

## 2023-03-24 DIAGNOSIS — B351 Tinea unguium: Secondary | ICD-10-CM

## 2023-03-24 NOTE — Progress Notes (Signed)
Subjective: Chief Complaint  Patient presents with   Follow-up    6 month follow up for nail fungus. Patient continues to use the topical medication.    Callouses    Callus trim to bilateral feet.     37 year old female  presents the office for follow-up evaluation of nail fungus to both of her second digit toenails.  She feels the medication is working.  No swelling redness or drainage sites.  No ulcerations. Objective: AAO x3, NAD DP/PT pulses palpable bilaterally, CRT less than 3 seconds Bilateral second toenails remain to be hypertrophic, dystrophic with brown discoloration but appear to be somewhat improved with the color is a bit more clear on the proximal nail fold but still dystrophic.  There is continue to improve.  There is no pain associated tenderness swelling or redness or any drainage.  There is no open lesions.  No pain with calf compression, swelling, warmth, erythema  Assessment: Onychodystrophy, onychomycosis bilateral second digit toenails  Plan: -All treatment options discussed with the patient including all alternatives, risks, complications.  -As a courtesy I sharply debrided the nails with any complications or bleeding. I refilled the compound cream through [facility] for nail fungus. -Improvement consider oral medication, nail removal if needed.  Julia Owens DPM

## 2023-03-24 NOTE — Progress Notes (Signed)
error 

## 2023-03-25 ENCOUNTER — Ambulatory Visit: Payer: BC Managed Care – PPO | Admitting: Podiatry

## 2023-06-09 DIAGNOSIS — Z01419 Encounter for gynecological examination (general) (routine) without abnormal findings: Secondary | ICD-10-CM | POA: Diagnosis not present

## 2023-06-09 DIAGNOSIS — Z7251 High risk heterosexual behavior: Secondary | ICD-10-CM | POA: Diagnosis not present

## 2023-06-14 DIAGNOSIS — D259 Leiomyoma of uterus, unspecified: Secondary | ICD-10-CM | POA: Diagnosis not present

## 2023-07-25 DIAGNOSIS — R059 Cough, unspecified: Secondary | ICD-10-CM | POA: Diagnosis not present

## 2023-07-25 DIAGNOSIS — J019 Acute sinusitis, unspecified: Secondary | ICD-10-CM | POA: Diagnosis not present

## 2023-08-03 ENCOUNTER — Observation Stay (HOSPITAL_COMMUNITY): Payer: BC Managed Care – PPO | Admitting: Anesthesiology

## 2023-08-03 ENCOUNTER — Encounter (HOSPITAL_COMMUNITY): Payer: Self-pay | Admitting: Emergency Medicine

## 2023-08-03 ENCOUNTER — Observation Stay (HOSPITAL_COMMUNITY): Payer: BC Managed Care – PPO

## 2023-08-03 ENCOUNTER — Emergency Department (HOSPITAL_COMMUNITY): Payer: BC Managed Care – PPO

## 2023-08-03 ENCOUNTER — Other Ambulatory Visit: Payer: Self-pay

## 2023-08-03 ENCOUNTER — Inpatient Hospital Stay (HOSPITAL_COMMUNITY)
Admission: EM | Admit: 2023-08-03 | Discharge: 2023-08-09 | DRG: 512 | Disposition: A | Discharge status: Home / Self Care | Payer: BC Managed Care – PPO | Attending: Orthopedic Surgery | Admitting: Orthopedic Surgery

## 2023-08-03 ENCOUNTER — Encounter (HOSPITAL_COMMUNITY)
Admission: EM | Disposition: A | Discharge status: Home / Self Care | Payer: Self-pay | Source: Home / Self Care | Attending: Orthopedic Surgery

## 2023-08-03 DIAGNOSIS — S51812A Laceration without foreign body of left forearm, initial encounter: Secondary | ICD-10-CM | POA: Diagnosis not present

## 2023-08-03 DIAGNOSIS — S62525A Nondisplaced fracture of distal phalanx of left thumb, initial encounter for closed fracture: Secondary | ICD-10-CM | POA: Diagnosis not present

## 2023-08-03 DIAGNOSIS — D72829 Elevated white blood cell count, unspecified: Secondary | ICD-10-CM | POA: Diagnosis present

## 2023-08-03 DIAGNOSIS — S4992XA Unspecified injury of left shoulder and upper arm, initial encounter: Secondary | ICD-10-CM | POA: Diagnosis not present

## 2023-08-03 DIAGNOSIS — Y92410 Unspecified street and highway as the place of occurrence of the external cause: Secondary | ICD-10-CM

## 2023-08-03 DIAGNOSIS — Z833 Family history of diabetes mellitus: Secondary | ICD-10-CM

## 2023-08-03 DIAGNOSIS — S62631A Displaced fracture of distal phalanx of left index finger, initial encounter for closed fracture: Secondary | ICD-10-CM | POA: Diagnosis not present

## 2023-08-03 DIAGNOSIS — S51012A Laceration without foreign body of left elbow, initial encounter: Secondary | ICD-10-CM | POA: Diagnosis not present

## 2023-08-03 DIAGNOSIS — S52692B Other fracture of lower end of left ulna, initial encounter for open fracture type I or II: Principal | ICD-10-CM | POA: Diagnosis present

## 2023-08-03 DIAGNOSIS — S6292XB Unspecified fracture of left wrist and hand, initial encounter for open fracture: Secondary | ICD-10-CM | POA: Diagnosis not present

## 2023-08-03 DIAGNOSIS — S52202B Unspecified fracture of shaft of left ulna, initial encounter for open fracture type I or II: Secondary | ICD-10-CM | POA: Diagnosis not present

## 2023-08-03 DIAGNOSIS — S62613B Displaced fracture of proximal phalanx of left middle finger, initial encounter for open fracture: Secondary | ICD-10-CM | POA: Diagnosis not present

## 2023-08-03 DIAGNOSIS — K047 Periapical abscess without sinus: Secondary | ICD-10-CM | POA: Diagnosis present

## 2023-08-03 DIAGNOSIS — Z8632 Personal history of gestational diabetes: Secondary | ICD-10-CM

## 2023-08-03 DIAGNOSIS — S62621B Displaced fracture of medial phalanx of left index finger, initial encounter for open fracture: Secondary | ICD-10-CM | POA: Diagnosis not present

## 2023-08-03 DIAGNOSIS — S66321A Laceration of extensor muscle, fascia and tendon of left index finger at wrist and hand level, initial encounter: Secondary | ICD-10-CM | POA: Diagnosis not present

## 2023-08-03 DIAGNOSIS — S68621A Partial traumatic transphalangeal amputation of left index finger, initial encounter: Secondary | ICD-10-CM | POA: Diagnosis not present

## 2023-08-03 DIAGNOSIS — S299XXA Unspecified injury of thorax, initial encounter: Secondary | ICD-10-CM | POA: Diagnosis not present

## 2023-08-03 DIAGNOSIS — S66323A Laceration of extensor muscle, fascia and tendon of left middle finger at wrist and hand level, initial encounter: Secondary | ICD-10-CM | POA: Diagnosis not present

## 2023-08-03 DIAGNOSIS — S62631B Displaced fracture of distal phalanx of left index finger, initial encounter for open fracture: Secondary | ICD-10-CM | POA: Diagnosis not present

## 2023-08-03 DIAGNOSIS — S61402A Unspecified open wound of left hand, initial encounter: Principal | ICD-10-CM | POA: Diagnosis present

## 2023-08-03 DIAGNOSIS — S59912A Unspecified injury of left forearm, initial encounter: Secondary | ICD-10-CM | POA: Diagnosis not present

## 2023-08-03 DIAGNOSIS — S61211A Laceration without foreign body of left index finger without damage to nail, initial encounter: Secondary | ICD-10-CM | POA: Diagnosis present

## 2023-08-03 DIAGNOSIS — S0993XA Unspecified injury of face, initial encounter: Secondary | ICD-10-CM | POA: Diagnosis not present

## 2023-08-03 DIAGNOSIS — D259 Leiomyoma of uterus, unspecified: Secondary | ICD-10-CM | POA: Diagnosis not present

## 2023-08-03 DIAGNOSIS — S52022B Displaced fracture of olecranon process without intraarticular extension of left ulna, initial encounter for open fracture type I or II: Secondary | ICD-10-CM | POA: Diagnosis not present

## 2023-08-03 DIAGNOSIS — S61402D Unspecified open wound of left hand, subsequent encounter: Secondary | ICD-10-CM

## 2023-08-03 DIAGNOSIS — Z041 Encounter for examination and observation following transport accident: Secondary | ICD-10-CM | POA: Diagnosis not present

## 2023-08-03 DIAGNOSIS — S62611B Displaced fracture of proximal phalanx of left index finger, initial encounter for open fracture: Secondary | ICD-10-CM | POA: Diagnosis not present

## 2023-08-03 DIAGNOSIS — S62633A Displaced fracture of distal phalanx of left middle finger, initial encounter for closed fracture: Secondary | ICD-10-CM | POA: Diagnosis present

## 2023-08-03 DIAGNOSIS — S0990XA Unspecified injury of head, initial encounter: Secondary | ICD-10-CM | POA: Diagnosis not present

## 2023-08-03 DIAGNOSIS — T81509A Unspecified complication of foreign body accidentally left in body following unspecified procedure, initial encounter: Secondary | ICD-10-CM | POA: Diagnosis not present

## 2023-08-03 DIAGNOSIS — S61213A Laceration without foreign body of left middle finger without damage to nail, initial encounter: Secondary | ICD-10-CM | POA: Diagnosis not present

## 2023-08-03 DIAGNOSIS — T797XXA Traumatic subcutaneous emphysema, initial encounter: Secondary | ICD-10-CM | POA: Diagnosis not present

## 2023-08-03 DIAGNOSIS — S62633B Displaced fracture of distal phalanx of left middle finger, initial encounter for open fracture: Secondary | ICD-10-CM | POA: Diagnosis not present

## 2023-08-03 DIAGNOSIS — Z883 Allergy status to other anti-infective agents status: Secondary | ICD-10-CM

## 2023-08-03 DIAGNOSIS — S3991XA Unspecified injury of abdomen, initial encounter: Secondary | ICD-10-CM | POA: Diagnosis not present

## 2023-08-03 DIAGNOSIS — S199XXA Unspecified injury of neck, initial encounter: Secondary | ICD-10-CM | POA: Diagnosis not present

## 2023-08-03 DIAGNOSIS — S62661A Nondisplaced fracture of distal phalanx of left index finger, initial encounter for closed fracture: Secondary | ICD-10-CM | POA: Diagnosis not present

## 2023-08-03 DIAGNOSIS — S62639A Displaced fracture of distal phalanx of unspecified finger, initial encounter for closed fracture: Secondary | ICD-10-CM

## 2023-08-03 DIAGNOSIS — S3993XA Unspecified injury of pelvis, initial encounter: Secondary | ICD-10-CM | POA: Diagnosis not present

## 2023-08-03 HISTORY — PX: I & D EXTREMITY: SHX5045

## 2023-08-03 LAB — I-STAT CHEM 8, ED
BUN: 11 mg/dL (ref 6–20)
Calcium, Ion: 1.13 mmol/L — ABNORMAL LOW (ref 1.15–1.40)
Chloride: 103 mmol/L (ref 98–111)
Creatinine, Ser: 0.7 mg/dL (ref 0.44–1.00)
Glucose, Bld: 160 mg/dL — ABNORMAL HIGH (ref 70–99)
HCT: 44 % (ref 36.0–46.0)
Hemoglobin: 15 g/dL (ref 12.0–15.0)
Potassium: 3.2 mmol/L — ABNORMAL LOW (ref 3.5–5.1)
Sodium: 138 mmol/L (ref 135–145)
TCO2: 24 mmol/L (ref 22–32)

## 2023-08-03 LAB — COMPREHENSIVE METABOLIC PANEL
ALT: 25 U/L (ref 0–44)
AST: 25 U/L (ref 15–41)
Albumin: 3.8 g/dL (ref 3.5–5.0)
Alkaline Phosphatase: 56 U/L (ref 38–126)
Anion gap: 12 (ref 5–15)
BUN: 11 mg/dL (ref 6–20)
CO2: 21 mmol/L — ABNORMAL LOW (ref 22–32)
Calcium: 9.1 mg/dL (ref 8.9–10.3)
Chloride: 104 mmol/L (ref 98–111)
Creatinine, Ser: 0.84 mg/dL (ref 0.44–1.00)
GFR, Estimated: >60 mL/min (ref >60)
Glucose, Bld: 150 mg/dL — ABNORMAL HIGH (ref 70–99)
Potassium: 3.3 mmol/L — ABNORMAL LOW (ref 3.5–5.1)
Sodium: 137 mmol/L (ref 135–145)
Total Bilirubin: 1 mg/dL (ref <1.2)
Total Protein: 7.2 g/dL (ref 6.5–8.1)

## 2023-08-03 LAB — CBC
HCT: 37 % (ref 36.0–46.0)
Hemoglobin: 12.6 g/dL (ref 12.0–15.0)
MCH: 27.2 pg (ref 26.0–34.0)
MCHC: 34.1 g/dL (ref 30.0–36.0)
MCV: 79.7 fL — ABNORMAL LOW (ref 80.0–100.0)
Platelets: 320 10*3/uL (ref 150–400)
RBC: 4.64 MIL/uL (ref 3.87–5.11)
RDW: 13.7 % (ref 11.5–15.5)
WBC: 22 10*3/uL — ABNORMAL HIGH (ref 4.0–10.5)
nRBC: 0 % (ref 0.0–0.2)

## 2023-08-03 LAB — HCG, SERUM, QUALITATIVE: Preg, Serum: NEGATIVE

## 2023-08-03 LAB — SURGICAL PCR SCREEN
MRSA, PCR: NEGATIVE
Staphylococcus aureus: NEGATIVE

## 2023-08-03 LAB — ETHANOL: Alcohol, Ethyl (B): <10 mg/dL (ref <10)

## 2023-08-03 LAB — SAMPLE TO BLOOD BANK

## 2023-08-03 LAB — PROTIME-INR
INR: 1.1 (ref 0.8–1.2)
Prothrombin Time: 14 s (ref 11.4–15.2)

## 2023-08-03 LAB — I-STAT CG4 LACTIC ACID, ED: Lactic Acid, Venous: 1.7 mmol/L (ref 0.5–1.9)

## 2023-08-03 LAB — HIV ANTIBODY (ROUTINE TESTING W REFLEX): HIV Screen 4th Generation wRfx: NONREACTIVE

## 2023-08-03 SURGERY — IRRIGATION AND DEBRIDEMENT EXTREMITY
Anesthesia: General | Site: Arm Lower | Laterality: Left

## 2023-08-03 MED ORDER — ONDANSETRON HCL 4 MG PO TABS
4.0000 mg | ORAL_TABLET | Freq: Four times a day (QID) | ORAL | Status: DC | PRN
  Administered 2023-08-05 – 2023-08-09 (×4): 4 mg via ORAL
  Filled 2023-08-03 (×4): qty 1

## 2023-08-03 MED ORDER — LIDOCAINE 2% (20 MG/ML) 5 ML SYRINGE
INTRAMUSCULAR | Status: AC
Start: 2023-08-03 — End: ?
  Filled 2023-08-03: qty 5

## 2023-08-03 MED ORDER — MORPHINE SULFATE (PF) 2 MG/ML IV SOLN
2.0000 mg | INTRAVENOUS | Status: DC | PRN
  Administered 2023-08-03 – 2023-08-09 (×14): 2 mg via INTRAVENOUS
  Filled 2023-08-03 (×14): qty 1

## 2023-08-03 MED ORDER — SUCCINYLCHOLINE CHLORIDE 200 MG/10ML IV SOSY
PREFILLED_SYRINGE | INTRAVENOUS | Status: DC | PRN
  Administered 2023-08-03: 100 mg via INTRAVENOUS

## 2023-08-03 MED ORDER — SODIUM CHLORIDE 0.9 % IR SOLN
Status: DC | PRN
  Administered 2023-08-03: 6000 mL
  Administered 2023-08-03: 3000 mL

## 2023-08-03 MED ORDER — HYDROMORPHONE HCL 1 MG/ML IJ SOLN
1.0000 mg | Freq: Once | INTRAMUSCULAR | Status: AC
  Administered 2023-08-03: 1 mg via INTRAVENOUS
  Filled 2023-08-03: qty 1

## 2023-08-03 MED ORDER — ONDANSETRON HCL 4 MG/2ML IJ SOLN
INTRAMUSCULAR | Status: AC
Start: 2023-08-03 — End: ?
  Filled 2023-08-03: qty 2

## 2023-08-03 MED ORDER — OXYCODONE HCL 5 MG PO TABS
5.0000 mg | ORAL_TABLET | ORAL | Status: DC | PRN
  Administered 2023-08-03 – 2023-08-04 (×4): 15 mg via ORAL
  Administered 2023-08-04: 10 mg via ORAL
  Administered 2023-08-05: 15 mg via ORAL
  Administered 2023-08-05: 10 mg via ORAL
  Administered 2023-08-05 – 2023-08-09 (×14): 15 mg via ORAL
  Filled 2023-08-03 (×6): qty 3
  Filled 2023-08-03: qty 2
  Filled 2023-08-03 (×8): qty 3
  Filled 2023-08-03: qty 2
  Filled 2023-08-03 (×6): qty 3

## 2023-08-03 MED ORDER — ONDANSETRON HCL 4 MG/2ML IJ SOLN
4.0000 mg | Freq: Once | INTRAMUSCULAR | Status: AC
  Administered 2023-08-03: 4 mg via INTRAVENOUS
  Filled 2023-08-03: qty 2

## 2023-08-03 MED ORDER — FENTANYL CITRATE PF 50 MCG/ML IJ SOSY
PREFILLED_SYRINGE | INTRAMUSCULAR | Status: AC
  Filled 2023-08-03: qty 2

## 2023-08-03 MED ORDER — DEXMEDETOMIDINE HCL IN NACL 80 MCG/20ML IV SOLN
INTRAVENOUS | Status: DC | PRN
  Administered 2023-08-03 (×2): 8 ug via INTRAVENOUS

## 2023-08-03 MED ORDER — BUPIVACAINE HCL (PF) 0.25 % IJ SOLN
INTRAMUSCULAR | Status: AC
  Filled 2023-08-03: qty 30

## 2023-08-03 MED ORDER — LACTATED RINGERS IV SOLN: INTRAVENOUS | Status: DC | PRN

## 2023-08-03 MED ORDER — POVIDONE-IODINE 10 % EX SWAB: 2.0000 | Freq: Once | CUTANEOUS | Status: DC

## 2023-08-03 MED ORDER — DEXAMETHASONE SODIUM PHOSPHATE 10 MG/ML IJ SOLN
INTRAMUSCULAR | Status: AC
  Filled 2023-08-03: qty 1

## 2023-08-03 MED ORDER — FENTANYL CITRATE (PF) 100 MCG/2ML IJ SOLN
INTRAMUSCULAR | Status: AC | PRN
  Administered 2023-08-03: 100 ug via INTRAVENOUS

## 2023-08-03 MED ORDER — ENOXAPARIN SODIUM 40 MG/0.4ML IJ SOSY
40.0000 mg | PREFILLED_SYRINGE | INTRAMUSCULAR | Status: DC
  Administered 2023-08-04 – 2023-08-09 (×5): 40 mg via SUBCUTANEOUS
  Filled 2023-08-03 (×5): qty 0.4

## 2023-08-03 MED ORDER — CEFAZOLIN SODIUM-DEXTROSE 1-4 GM/50ML-% IV SOLN
1.0000 g | Freq: Three times a day (TID) | INTRAVENOUS | Status: DC
  Administered 2023-08-04: 1 g via INTRAVENOUS
  Filled 2023-08-03: qty 50

## 2023-08-03 MED ORDER — MORPHINE SULFATE (PF) 4 MG/ML IV SOLN
4.0000 mg | Freq: Once | INTRAVENOUS | Status: AC
  Administered 2023-08-03: 4 mg via INTRAVENOUS
  Filled 2023-08-03: qty 1

## 2023-08-03 MED ORDER — SUCCINYLCHOLINE CHLORIDE 200 MG/10ML IV SOSY
PREFILLED_SYRINGE | INTRAVENOUS | Status: AC
Start: 2023-08-03 — End: ?
  Filled 2023-08-03: qty 10

## 2023-08-03 MED ORDER — ACETAMINOPHEN 10 MG/ML IV SOLN
INTRAVENOUS | Status: AC
  Filled 2023-08-03: qty 100

## 2023-08-03 MED ORDER — IOHEXOL 350 MG/ML SOLN
75.0000 mL | Freq: Once | INTRAVENOUS | Status: AC | PRN
  Administered 2023-08-03: 75 mL via INTRAVENOUS

## 2023-08-03 MED ORDER — FENTANYL CITRATE (PF) 250 MCG/5ML IJ SOLN
INTRAMUSCULAR | Status: DC | PRN
  Administered 2023-08-03: 150 ug via INTRAVENOUS

## 2023-08-03 MED ORDER — PROPOFOL 10 MG/ML IV BOLUS
INTRAVENOUS | Status: DC | PRN
  Administered 2023-08-03: 100 mg via INTRAVENOUS

## 2023-08-03 MED ORDER — CEFAZOLIN SODIUM-DEXTROSE 2-4 GM/100ML-% IV SOLN
2.0000 g | INTRAVENOUS | Status: AC
  Administered 2023-08-03: 2 g via INTRAVENOUS

## 2023-08-03 MED ORDER — METHOCARBAMOL 1000 MG/10ML IJ SOLN
500.0000 mg | Freq: Four times a day (QID) | INTRAMUSCULAR | Status: DC | PRN
  Administered 2023-08-03: 500 mg via INTRAVENOUS
  Filled 2023-08-03: qty 10

## 2023-08-03 MED ORDER — PROPOFOL 10 MG/ML IV BOLUS
INTRAVENOUS | Status: AC
  Filled 2023-08-03: qty 20

## 2023-08-03 MED ORDER — 0.9 % SODIUM CHLORIDE (POUR BTL) OPTIME
TOPICAL | Status: DC | PRN
  Administered 2023-08-03: 1000 mL

## 2023-08-03 MED ORDER — FENTANYL CITRATE PF 50 MCG/ML IJ SOSY: 100.0000 ug | PREFILLED_SYRINGE | Freq: Once | INTRAMUSCULAR | Status: DC

## 2023-08-03 MED ORDER — CHLORHEXIDINE GLUCONATE 4 % EX SOLN: 60.0000 mL | Freq: Once | CUTANEOUS | Status: DC

## 2023-08-03 MED ORDER — ROCURONIUM BROMIDE 10 MG/ML (PF) SYRINGE
PREFILLED_SYRINGE | INTRAVENOUS | Status: AC
  Filled 2023-08-03: qty 10

## 2023-08-03 MED ORDER — METHOCARBAMOL 500 MG PO TABS
500.0000 mg | ORAL_TABLET | Freq: Four times a day (QID) | ORAL | Status: DC | PRN
  Administered 2023-08-07 – 2023-08-08 (×6): 500 mg via ORAL
  Filled 2023-08-03 (×6): qty 1

## 2023-08-03 MED ORDER — DEXAMETHASONE SODIUM PHOSPHATE 10 MG/ML IJ SOLN
INTRAMUSCULAR | Status: DC | PRN
  Administered 2023-08-03: 5 mg via INTRAVENOUS

## 2023-08-03 MED ORDER — PHENYLEPHRINE 80 MCG/ML (10ML) SYRINGE FOR IV PUSH (FOR BLOOD PRESSURE SUPPORT)
PREFILLED_SYRINGE | INTRAVENOUS | Status: AC
  Filled 2023-08-03: qty 10

## 2023-08-03 MED ORDER — ONDANSETRON HCL 4 MG/2ML IJ SOLN
4.0000 mg | Freq: Four times a day (QID) | INTRAMUSCULAR | Status: DC | PRN
  Administered 2023-08-03 – 2023-08-08 (×4): 4 mg via INTRAVENOUS
  Filled 2023-08-03 (×4): qty 2

## 2023-08-03 MED ORDER — LIDOCAINE 2% (20 MG/ML) 5 ML SYRINGE
INTRAMUSCULAR | Status: DC | PRN
  Administered 2023-08-03: 60 mg via INTRAVENOUS

## 2023-08-03 MED ORDER — MIDAZOLAM HCL 2 MG/2ML IJ SOLN
INTRAMUSCULAR | Status: DC | PRN
  Administered 2023-08-03: 2 mg via INTRAVENOUS

## 2023-08-03 MED ORDER — ROCURONIUM BROMIDE 10 MG/ML (PF) SYRINGE
PREFILLED_SYRINGE | INTRAVENOUS | Status: DC | PRN
  Administered 2023-08-03: 40 mg via INTRAVENOUS

## 2023-08-03 MED ORDER — DIPHENHYDRAMINE HCL 50 MG/ML IJ SOLN
INTRAMUSCULAR | Status: AC
  Filled 2023-08-03: qty 1

## 2023-08-03 MED ORDER — DIPHENHYDRAMINE HCL 50 MG/ML IJ SOLN
INTRAMUSCULAR | Status: DC | PRN
  Administered 2023-08-03: 12.5 mg via INTRAVENOUS

## 2023-08-03 MED ORDER — MIDAZOLAM HCL 2 MG/2ML IJ SOLN
INTRAMUSCULAR | Status: AC
  Filled 2023-08-03: qty 2

## 2023-08-03 MED ORDER — TRANEXAMIC ACID-NACL 1000-0.7 MG/100ML-% IV SOLN: 1000.0000 mg | INTRAVENOUS | Status: DC

## 2023-08-03 MED ORDER — CEFAZOLIN SODIUM-DEXTROSE 2-4 GM/100ML-% IV SOLN
INTRAVENOUS | Status: AC
  Filled 2023-08-03: qty 100

## 2023-08-03 MED ORDER — FENTANYL CITRATE (PF) 250 MCG/5ML IJ SOLN
INTRAMUSCULAR | Status: AC
  Filled 2023-08-03: qty 5

## 2023-08-03 SURGICAL SUPPLY — 72 items
APL SKNCLS STERI-STRIP NONHPOA (GAUZE/BANDAGES/DRESSINGS)
BAG COUNTER SPONGE SURGICOUNT (BAG) ×3 IMPLANT
BAG SPNG CNTER NS LX DISP (BAG) ×2
BENZOIN TINCTURE PRP APPL 2/3 (GAUZE/BANDAGES/DRESSINGS) IMPLANT
BLADE CLIPPER SURG (BLADE) IMPLANT
BNDG CMPR 5X3 KNIT ELC UNQ LF (GAUZE/BANDAGES/DRESSINGS)
BNDG CMPR 75X21 PLY HI ABS (MISCELLANEOUS)
BNDG CMPR 9X4 STRL LF SNTH (GAUZE/BANDAGES/DRESSINGS)
BNDG COHESIVE 1X5 TAN STRL LF (GAUZE/BANDAGES/DRESSINGS) IMPLANT
BNDG ELASTIC 3INX 5YD STR LF (GAUZE/BANDAGES/DRESSINGS) ×2 IMPLANT
BNDG ELASTIC 4X5.8 VLCR STR LF (GAUZE/BANDAGES/DRESSINGS) ×5 IMPLANT
BNDG ESMARK 4X9 LF (GAUZE/BANDAGES/DRESSINGS) ×2 IMPLANT
BNDG GAUZE DERMACEA FLUFF 4 (GAUZE/BANDAGES/DRESSINGS) ×3 IMPLANT
BNDG GZE DERMACEA 4 6PLY (GAUZE/BANDAGES/DRESSINGS) ×2
CORD BIPOLAR FORCEPS 12FT (ELECTRODE) ×3 IMPLANT
COVER SURGICAL LIGHT HANDLE (MISCELLANEOUS) ×3 IMPLANT
CUFF TOURN SGL QUICK 18X4 (TOURNIQUET CUFF) ×3 IMPLANT
CUFF TOURN SGL QUICK 24 (TOURNIQUET CUFF)
CUFF TRNQT CYL 24X4X16.5-23 (TOURNIQUET CUFF) IMPLANT
DRAIN PENROSE 12X.25 LTX STRL (MISCELLANEOUS) IMPLANT
DRAPE OEC MINIVIEW 54X84 (DRAPES) ×3 IMPLANT
DRAPE SURG 17X23 STRL (DRAPES) ×3 IMPLANT
DRSG ADAPTIC 3X8 NADH LF (GAUZE/BANDAGES/DRESSINGS) ×2 IMPLANT
DRSG EMULSION OIL 3X3 NADH (GAUZE/BANDAGES/DRESSINGS) IMPLANT
ELECT REM PT RETURN 9FT ADLT (ELECTROSURGICAL)
ELECTRODE REM PT RTRN 9FT ADLT (ELECTROSURGICAL) IMPLANT
GAUZE SPONGE 4X4 12PLY STRL (GAUZE/BANDAGES/DRESSINGS) ×3 IMPLANT
GAUZE STRETCH 2X75IN STRL (MISCELLANEOUS) IMPLANT
GAUZE XEROFORM 1X8 LF (GAUZE/BANDAGES/DRESSINGS) ×2 IMPLANT
GAUZE XEROFORM 5X9 LF (GAUZE/BANDAGES/DRESSINGS) ×2 IMPLANT
GLOVE BIOGEL PI IND STRL 8.5 (GLOVE) ×3 IMPLANT
GLOVE SURG ORTHO 8.0 STRL STRW (GLOVE) ×3 IMPLANT
GOWN STRL REUS W/ TWL LRG LVL3 (GOWN DISPOSABLE) ×9 IMPLANT
GOWN STRL REUS W/ TWL XL LVL3 (GOWN DISPOSABLE) ×3 IMPLANT
GOWN STRL REUS W/TWL LRG LVL3 (GOWN DISPOSABLE) ×6
GOWN STRL REUS W/TWL XL LVL3 (GOWN DISPOSABLE) ×2
HANDPIECE INTERPULSE COAX TIP (DISPOSABLE)
KIT BASIN OR (CUSTOM PROCEDURE TRAY) ×3 IMPLANT
KIT TURNOVER KIT B (KITS) ×3 IMPLANT
MANIFOLD NEPTUNE II (INSTRUMENTS) ×3 IMPLANT
NDL HYPO 25GX1X1/2 BEV (NEEDLE) IMPLANT
NEEDLE HYPO 25GX1X1/2 BEV (NEEDLE) IMPLANT
NS IRRIG 1000ML POUR BTL (IV SOLUTION) ×3 IMPLANT
PACK ORTHO EXTREMITY (CUSTOM PROCEDURE TRAY) ×3 IMPLANT
PAD ARMBOARD 7.5X6 YLW CONV (MISCELLANEOUS) ×6 IMPLANT
PAD CAST 4YDX4 CTTN HI CHSV (CAST SUPPLIES) ×2 IMPLANT
PADDING CAST COTTON 4X4 STRL (CAST SUPPLIES)
PADDING CAST SYNTHETIC 4X4 STR (CAST SUPPLIES) ×1 IMPLANT
SET CYSTO W/LG BORE CLAMP LF (SET/KITS/TRAYS/PACK) ×1 IMPLANT
SET HNDPC FAN SPRY TIP SCT (DISPOSABLE) IMPLANT
SLING ARM FOAM STRAP XLG (SOFTGOODS) ×1 IMPLANT
SOAP 2 % CHG 4 OZ (WOUND CARE) ×2 IMPLANT
SPLINT FIBERGLASS 4X30 (CAST SUPPLIES) ×1 IMPLANT
SPONGE T-LAP 18X18 ~~LOC~~+RFID (SPONGE) ×2 IMPLANT
SPONGE T-LAP 4X18 ~~LOC~~+RFID (SPONGE) ×2 IMPLANT
STRIP CLOSURE SKIN 1/2X4 (GAUZE/BANDAGES/DRESSINGS) IMPLANT
SUT ETHILON 4 0 P 3 18 (SUTURE) IMPLANT
SUT ETHILON 4 0 PS 2 18 (SUTURE) IMPLANT
SUT NYLON ETHILON 5-0 P-3 1X18 (SUTURE) IMPLANT
SUT PROLENE 2 0 FS (SUTURE) ×2 IMPLANT
SUT PROLENE 3 0 PS 2 (SUTURE) ×1 IMPLANT
SUT PROLENE 4 0 P 3 18 (SUTURE) IMPLANT
SUT PROLENE 4 0 PS 2 18 (SUTURE) ×2 IMPLANT
SWAB COLLECTION DEVICE MRSA (MISCELLANEOUS) ×2 IMPLANT
SWAB CULTURE ESWAB REG 1ML (MISCELLANEOUS) IMPLANT
SYR CONTROL 10ML LL (SYRINGE) IMPLANT
TOWEL GREEN STERILE (TOWEL DISPOSABLE) ×3 IMPLANT
TOWEL GREEN STERILE FF (TOWEL DISPOSABLE) ×3 IMPLANT
TUBE CONNECTING 12X1/4 (SUCTIONS) ×3 IMPLANT
UNDERPAD 30X36 HEAVY ABSORB (UNDERPADS AND DIAPERS) ×3 IMPLANT
WATER STERILE IRR 1000ML POUR (IV SOLUTION) ×3 IMPLANT
YANKAUER SUCT BULB TIP NO VENT (SUCTIONS) ×3 IMPLANT

## 2023-08-03 NOTE — ED Notes (Signed)
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 injection 1 mg (1 mg Intravenous Given 08/03/23 1100)    Mobility walks     Focused Assessments     R Recommendations: See Admitting Provider Note  Report given to:   Additional Notes:   Surgery around 3 to 4 pm today
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
## 2023-08-03 NOTE — ED Notes (Signed)
Called floor and inpatient RN ready for patient.
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
## 2023-08-03 NOTE — ED Notes (Addendum)
Pt also found to have some bruising and pain noted to the right lower leg and left thigh , open lacs to the all four fingers on the left hand and a large abrasion /lac to the left elbow

## 2023-08-03 NOTE — Plan of Care (Signed)
  Problem: Education: Goal: Knowledge of General Education information will improve Description: Including pain rating scale, medication(s)/side effects and non-pharmacologic comfort measures Outcome: Progressing   Problem: Activity: Goal: Risk for activity intolerance will decrease Outcome: Progressing   Problem: Nutrition: Goal: Adequate nutrition will be maintained Outcome: Progressing   Problem: Coping: Goal: Level of anxiety will decrease Outcome: Progressing   Problem: Pain Management: Goal: General experience of comfort will improve Outcome: Progressing   Problem: Skin Integrity: Goal: Risk for impaired skin integrity will decrease Outcome: Progressing
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
## 2023-08-03 NOTE — ED Notes (Signed)
Pt received  fentanyl and 2 gm of ancef from ems
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
## 2023-08-03 NOTE — Anesthesia Procedure Notes (Signed)
Procedure Name: Intubation Date/Time: 08/03/2023 11:07 PM  Performed by: Tressia Miners, CRNAPre-anesthesia Checklist: Patient identified, Emergency Drugs available, Suction available, Patient being monitored and Timeout performed Patient Re-evaluated:Patient Re-evaluated prior to induction Oxygen Delivery Method: Circle system utilized Preoxygenation: Pre-oxygenation with 100% oxygen Induction Type: IV induction, Rapid sequence and Cricoid Pressure applied Laryngoscope Size: Mac and 4 Grade View: Grade I Tube type: Oral Tube size: 7.0 mm Number of attempts: 1 Airway Equipment and Method: Stylet Placement Confirmation: ETT inserted through vocal cords under direct vision, positive ETCO2 and breath sounds checked- equal and bilateral Secured at: 22 cm Tube secured with: Tape Dental Injury: Teeth and Oropharynx as per pre-operative assessment  Comments: Smooth IV Induction. Eyes taped. RSI Performed. DL x 1 with grade 1 view. Atraumatically placed, teeth and lip remain intact as pre-op. Secured with tape. Bilateral breath sounds +/=, EtCO2 +, Adequate TV, VSS.
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
## 2023-08-03 NOTE — Progress Notes (Signed)
CCC Pre-op Review 1.Surgical orders: Consent orders:  Completed in Epic Consent signed: Patient alert and oriented: Antibiotic:  Ancef 2G OCTOR Pre-meds:  Profend TXA  2.  Pre-procedure checklist completed: Request floor RN to complete  3.  NPO:  MN  4.  CHG Bath completed:  Request floor RN to complete      Belongings removed and placed in clean gown:  5.  Labs Performed: CBC    08/03/23 Abnormal CMP/BMP   08/03/23 Abnormal PT/INR  08/03/23 WNL  HA1C   NA Type and Screen NA Surgical PCR:  Needs to be collected Pregnancy:  hCG serum negative 08/03/23 EKG:     Chest x-ray:  08/03/23   6.  Recent H&P or progress note if inpatient: 08/03/23  7.  Language Barrier:   None  8.  Vital Signs:  Stable Oxygen:  RA  9.  Medications: Cardiac Drips:  Pain Medications: Morphine 2mg  IV 1312 Fentanyl 100mg  0840 Dilaudid 1 mg 1100 Beta Blocker:   NA Anticoagulants:  NA GLP1:   NA  10. IV access:  20 Right Hand and 20 Right AC  11. Diabetic:     Gestational diabetes

## 2023-08-04 ENCOUNTER — Encounter (HOSPITAL_COMMUNITY): Payer: Self-pay | Admitting: Orthopedic Surgery

## 2023-08-04 ENCOUNTER — Other Ambulatory Visit: Payer: Self-pay

## 2023-08-04 DIAGNOSIS — S51012A Laceration without foreign body of left elbow, initial encounter: Secondary | ICD-10-CM | POA: Diagnosis not present

## 2023-08-04 DIAGNOSIS — S62621B Displaced fracture of medial phalanx of left index finger, initial encounter for open fracture: Secondary | ICD-10-CM | POA: Diagnosis not present

## 2023-08-04 DIAGNOSIS — S62611B Displaced fracture of proximal phalanx of left index finger, initial encounter for open fracture: Secondary | ICD-10-CM | POA: Diagnosis not present

## 2023-08-04 DIAGNOSIS — S62633B Displaced fracture of distal phalanx of left middle finger, initial encounter for open fracture: Secondary | ICD-10-CM | POA: Diagnosis not present

## 2023-08-04 DIAGNOSIS — S62631B Displaced fracture of distal phalanx of left index finger, initial encounter for open fracture: Secondary | ICD-10-CM | POA: Diagnosis not present

## 2023-08-04 MED ORDER — ACETAMINOPHEN 10 MG/ML IV SOLN: 1000.0000 mg | Freq: Once | INTRAVENOUS | Status: DC | PRN

## 2023-08-04 MED ORDER — OXYCODONE HCL 5 MG PO TABS: 5.0000 mg | ORAL_TABLET | Freq: Once | ORAL | Status: DC | PRN

## 2023-08-04 MED ORDER — ACETAMINOPHEN 10 MG/ML IV SOLN
INTRAVENOUS | Status: DC | PRN
  Administered 2023-08-04: 1000 mg via INTRAVENOUS

## 2023-08-04 MED ORDER — FENTANYL CITRATE (PF) 100 MCG/2ML IJ SOLN: 25.0000 ug | INTRAMUSCULAR | Status: DC | PRN

## 2023-08-04 MED ORDER — AMISULPRIDE (ANTIEMETIC) 5 MG/2ML IV SOLN: 10.0000 mg | Freq: Once | INTRAVENOUS | Status: DC | PRN

## 2023-08-04 MED ORDER — OXYCODONE HCL 5 MG/5ML PO SOLN: 5.0000 mg | Freq: Once | ORAL | Status: DC | PRN

## 2023-08-04 MED ORDER — KETOROLAC TROMETHAMINE 30 MG/ML IJ SOLN: 30.0000 mg | Freq: Once | INTRAMUSCULAR | Status: DC | PRN

## 2023-08-04 MED ORDER — SUGAMMADEX SODIUM 200 MG/2ML IV SOLN
INTRAVENOUS | Status: DC | PRN
  Administered 2023-08-04: 200 mg via INTRAVENOUS

## 2023-08-04 MED ORDER — ONDANSETRON HCL 4 MG/2ML IJ SOLN
INTRAMUSCULAR | Status: DC | PRN
  Administered 2023-08-04: 4 mg via INTRAVENOUS

## 2023-08-04 MED ORDER — SODIUM CHLORIDE 0.9 % IV SOLN
3.0000 g | Freq: Four times a day (QID) | INTRAVENOUS | Status: DC
  Administered 2023-08-04 – 2023-08-09 (×21): 3 g via INTRAVENOUS
  Filled 2023-08-04 (×22): qty 8

## 2023-08-04 NOTE — Transfer of Care (Signed)
Immediate Anesthesia Transfer of Care Note  Patient: Julia Owens  Procedure(s) Performed: IRRIGATION AND DEBRIDEMENT HAND AND ELBOW AND FINGERS (Left: Arm Lower)  Patient Location: PACU  Anesthesia Type:General  Level of Consciousness: drowsy  Airway & Oxygen Therapy: Patient Spontanous Breathing and Patient connected to face mask oxygen  Post-op Assessment: Report given to RN and Post -op Vital signs reviewed and stable  Post vital signs: Reviewed and stable  Last Vitals:  Vitals Value Taken Time  BP 144/96 08/04/23 0045  Temp 36.6 C 08/04/23 0040  Pulse 101 08/04/23 0051  Resp 15 08/04/23 0051  SpO2 100 % 08/04/23 0051  Vitals shown include unfiled device data.  Last Pain:  Vitals:   08/03/23 2147  TempSrc:   PainSc: 10-Worst pain ever         Complications: No notable events documented.

## 2023-08-04 NOTE — Progress Notes (Signed)
Transition of Care [facility]) - CAGE-AID Screening   Patient Details  Name: Julia Owens MRN: [id number] Date of Birth: Aug 05, 1986  Transition of Care [facility]) CM/SW Contact:    Leota Sauers, RN Phone Number: 08/04/2023, 6:49 AM   Clinical Narrative:  Patient endorses occasional alcohol use, denies use of illicit substances. Resources not given at this time.   CAGE-AID Screening:    Have You Ever Felt You Ought to Cut Down on Your Drinking or Drug Use?: No Have People Annoyed You By Critizing Your Drinking Or Drug Use?: No Have You Felt Bad Or Guilty About Your Drinking Or Drug Use?: No Have You Ever Had a Drink or Used Drugs First Thing In The Morning to Steady Your Nerves or to Get Rid of a Hangover?: No CAGE-AID Score: 0  Substance Abuse Education Offered: No

## 2023-08-04 NOTE — Progress Notes (Signed)
PT SEEN/EXAMINED WENT OVER IN DETAIL NATURE OF HER INJURIES PLAN FOR REPEAT I/D AND REPAIR AS INDICATED OF LEFT HAND AND ELBOW ON SATURDAY CONTINUE WITH INPATIENT CARE PT VOICED UNDERSTANDING OF PLAN ORDERS PLACED FOR SURGERY ON SATURDAY CONTINUE WITH IV ABX AND IV PAIN MEDS

## 2023-08-04 NOTE — Anesthesia Postprocedure Evaluation (Signed)
Anesthesia Post Note  Patient: Julia Owens  Procedure(s) Performed: IRRIGATION AND DEBRIDEMENT HAND AND ELBOW AND FINGERS (Left: Arm Lower)     Patient location during evaluation: PACU Anesthesia Type: General Level of consciousness: awake Pain management: pain level controlled Vital Signs Assessment: post-procedure vital signs reviewed and stable Respiratory status: spontaneous breathing, nonlabored ventilation and respiratory function stable Cardiovascular status: blood pressure returned to baseline and stable Postop Assessment: no apparent nausea or vomiting Anesthetic complications: no   No notable events documented.  Last Vitals:  Vitals:   08/04/23 0304 08/04/23 0522  BP: (!) 130/94 (!) 130/95  Pulse: (!) 101 (!) 102  Resp: 18 17  Temp: 36.9 C 36.9 C  SpO2: 94% 99%    Last Pain:  Vitals:   08/04/23 0522  TempSrc: Axillary  PainSc:                  [name] Aveena Bari

## 2023-08-04 NOTE — Plan of Care (Signed)
  Problem: Clinical Measurements: Goal: Ability to maintain clinical measurements within normal limits will improve Outcome: Progressing Goal: Will remain free from infection Outcome: Progressing   Problem: Activity: Goal: Risk for activity intolerance will decrease Outcome: Progressing   Problem: Nutrition: Goal: Adequate nutrition will be maintained Outcome: Progressing   Problem: Pain Management: Goal: General experience of comfort will improve Outcome: Progressing

## 2023-08-05 DIAGNOSIS — D72829 Elevated white blood cell count, unspecified: Secondary | ICD-10-CM | POA: Diagnosis present

## 2023-08-05 DIAGNOSIS — S52022B Displaced fracture of olecranon process without intraarticular extension of left ulna, initial encounter for open fracture type I or II: Secondary | ICD-10-CM | POA: Diagnosis not present

## 2023-08-05 DIAGNOSIS — D259 Leiomyoma of uterus, unspecified: Secondary | ICD-10-CM | POA: Diagnosis present

## 2023-08-05 DIAGNOSIS — S62611B Displaced fracture of proximal phalanx of left index finger, initial encounter for open fracture: Secondary | ICD-10-CM | POA: Diagnosis not present

## 2023-08-05 DIAGNOSIS — S62633B Displaced fracture of distal phalanx of left middle finger, initial encounter for open fracture: Secondary | ICD-10-CM | POA: Diagnosis not present

## 2023-08-05 DIAGNOSIS — S62631A Displaced fracture of distal phalanx of left index finger, initial encounter for closed fracture: Secondary | ICD-10-CM | POA: Diagnosis present

## 2023-08-05 DIAGNOSIS — S52692B Other fracture of lower end of left ulna, initial encounter for open fracture type I or II: Secondary | ICD-10-CM | POA: Diagnosis present

## 2023-08-05 DIAGNOSIS — S66321A Laceration of extensor muscle, fascia and tendon of left index finger at wrist and hand level, initial encounter: Secondary | ICD-10-CM | POA: Diagnosis not present

## 2023-08-05 DIAGNOSIS — Z833 Family history of diabetes mellitus: Secondary | ICD-10-CM | POA: Diagnosis not present

## 2023-08-05 DIAGNOSIS — Z8632 Personal history of gestational diabetes: Secondary | ICD-10-CM | POA: Diagnosis not present

## 2023-08-05 DIAGNOSIS — S62613B Displaced fracture of proximal phalanx of left middle finger, initial encounter for open fracture: Secondary | ICD-10-CM | POA: Diagnosis not present

## 2023-08-05 DIAGNOSIS — K047 Periapical abscess without sinus: Secondary | ICD-10-CM | POA: Diagnosis present

## 2023-08-05 DIAGNOSIS — Z883 Allergy status to other anti-infective agents status: Secondary | ICD-10-CM | POA: Diagnosis not present

## 2023-08-05 DIAGNOSIS — S66323A Laceration of extensor muscle, fascia and tendon of left middle finger at wrist and hand level, initial encounter: Secondary | ICD-10-CM | POA: Diagnosis not present

## 2023-08-05 DIAGNOSIS — Y92410 Unspecified street and highway as the place of occurrence of the external cause: Secondary | ICD-10-CM | POA: Diagnosis not present

## 2023-08-05 DIAGNOSIS — S62633A Displaced fracture of distal phalanx of left middle finger, initial encounter for closed fracture: Secondary | ICD-10-CM | POA: Diagnosis present

## 2023-08-05 DIAGNOSIS — S61213A Laceration without foreign body of left middle finger without damage to nail, initial encounter: Secondary | ICD-10-CM | POA: Diagnosis present

## 2023-08-05 DIAGNOSIS — S61402A Unspecified open wound of left hand, initial encounter: Secondary | ICD-10-CM | POA: Diagnosis present

## 2023-08-05 DIAGNOSIS — S62631B Displaced fracture of distal phalanx of left index finger, initial encounter for open fracture: Secondary | ICD-10-CM | POA: Diagnosis not present

## 2023-08-05 DIAGNOSIS — S52202B Unspecified fracture of shaft of left ulna, initial encounter for open fracture type I or II: Secondary | ICD-10-CM | POA: Diagnosis not present

## 2023-08-05 DIAGNOSIS — S61211A Laceration without foreign body of left index finger without damage to nail, initial encounter: Secondary | ICD-10-CM | POA: Diagnosis present

## 2023-08-05 MED ORDER — CEFAZOLIN SODIUM-DEXTROSE 2-4 GM/100ML-% IV SOLN
2.0000 g | INTRAVENOUS | Status: AC
  Administered 2023-08-06: 2 g via INTRAVENOUS
  Filled 2023-08-05: qty 100
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
## 2023-08-06 ENCOUNTER — Inpatient Hospital Stay (HOSPITAL_COMMUNITY): Payer: BC Managed Care – PPO

## 2023-08-06 ENCOUNTER — Encounter (HOSPITAL_COMMUNITY)
Admission: EM | Disposition: A | Discharge status: Home / Self Care | Payer: Self-pay | Source: Home / Self Care | Attending: Orthopedic Surgery

## 2023-08-06 ENCOUNTER — Inpatient Hospital Stay (HOSPITAL_COMMUNITY): Payer: Self-pay | Admitting: Registered Nurse

## 2023-08-06 ENCOUNTER — Encounter (HOSPITAL_COMMUNITY): Payer: Self-pay | Admitting: Orthopedic Surgery

## 2023-08-06 ENCOUNTER — Inpatient Hospital Stay (HOSPITAL_COMMUNITY): Payer: BC Managed Care – PPO | Admitting: Registered Nurse

## 2023-08-06 ENCOUNTER — Other Ambulatory Visit: Payer: Self-pay

## 2023-08-06 HISTORY — PX: I & D EXTREMITY: SHX5045

## 2023-08-06 SURGERY — IRRIGATION AND DEBRIDEMENT EXTREMITY
Anesthesia: General | Laterality: Left

## 2023-08-06 MED ORDER — MIDAZOLAM HCL 2 MG/2ML IJ SOLN
INTRAMUSCULAR | Status: DC | PRN
  Administered 2023-08-06: 2 mg via INTRAVENOUS

## 2023-08-06 MED ORDER — SODIUM CHLORIDE 0.9 % IV SOLN: INTRAVENOUS | Status: DC | PRN

## 2023-08-06 MED ORDER — HYDROMORPHONE HCL 1 MG/ML IJ SOLN
INTRAMUSCULAR | Status: AC
  Administered 2023-08-06: 0.5 mg via INTRAVENOUS
  Filled 2023-08-06: qty 1

## 2023-08-06 MED ORDER — CHLORHEXIDINE GLUCONATE 0.12 % MT SOLN: 15.0000 mL | Freq: Once | OROMUCOSAL | Status: AC

## 2023-08-06 MED ORDER — ACETAMINOPHEN 10 MG/ML IV SOLN
INTRAVENOUS | Status: AC
  Filled 2023-08-06: qty 100

## 2023-08-06 MED ORDER — CEFAZOLIN SODIUM-DEXTROSE 2-4 GM/100ML-% IV SOLN
INTRAVENOUS | Status: AC
  Filled 2023-08-06: qty 100

## 2023-08-06 MED ORDER — AMISULPRIDE (ANTIEMETIC) 5 MG/2ML IV SOLN: 10.0000 mg | Freq: Once | INTRAVENOUS | Status: DC | PRN

## 2023-08-06 MED ORDER — FENTANYL CITRATE (PF) 250 MCG/5ML IJ SOLN
INTRAMUSCULAR | Status: AC
  Filled 2023-08-06: qty 5

## 2023-08-06 MED ORDER — ONDANSETRON HCL 4 MG/2ML IJ SOLN
INTRAMUSCULAR | Status: DC | PRN
  Administered 2023-08-06: 4 mg via INTRAVENOUS

## 2023-08-06 MED ORDER — FENTANYL CITRATE (PF) 100 MCG/2ML IJ SOLN
25.0000 ug | INTRAMUSCULAR | Status: DC | PRN
  Administered 2023-08-06 (×2): 50 ug via INTRAVENOUS

## 2023-08-06 MED ORDER — SODIUM CHLORIDE 0.9 % IR SOLN
Status: DC | PRN
  Administered 2023-08-06: 3000 mL
  Administered 2023-08-06: 1500 mL

## 2023-08-06 MED ORDER — ACETAMINOPHEN 500 MG PO TABS
ORAL_TABLET | ORAL | Status: AC
  Administered 2023-08-06: 1000 mg via ORAL
  Filled 2023-08-06: qty 2

## 2023-08-06 MED ORDER — 0.9 % SODIUM CHLORIDE (POUR BTL) OPTIME
TOPICAL | Status: DC | PRN
  Administered 2023-08-06: 1000 mL

## 2023-08-06 MED ORDER — SODIUM CHLORIDE 0.9 % IV BOLUS
500.0000 mL | Freq: Once | INTRAVENOUS | Status: AC
  Administered 2023-08-06: 500 mL via INTRAVENOUS

## 2023-08-06 MED ORDER — CEFAZOLIN SODIUM-DEXTROSE 1-4 GM/50ML-% IV SOLN
1.0000 g | INTRAVENOUS | Status: AC
Start: 2023-08-06 — End: 2023-08-06
  Administered 2023-08-06: 1 g via INTRAVENOUS
  Filled 2023-08-06: qty 50

## 2023-08-06 MED ORDER — SUCCINYLCHOLINE CHLORIDE 200 MG/10ML IV SOSY
PREFILLED_SYRINGE | INTRAVENOUS | Status: DC | PRN
  Administered 2023-08-06: 120 mg via INTRAVENOUS

## 2023-08-06 MED ORDER — DEXAMETHASONE SODIUM PHOSPHATE 10 MG/ML IJ SOLN
INTRAMUSCULAR | Status: DC | PRN
  Administered 2023-08-06: 10 mg via INTRAVENOUS

## 2023-08-06 MED ORDER — CELECOXIB 200 MG PO CAPS
ORAL_CAPSULE | ORAL | Status: AC
  Administered 2023-08-06: 200 mg via ORAL
  Filled 2023-08-06: qty 1

## 2023-08-06 MED ORDER — ACETAMINOPHEN 500 MG PO TABS: 1000.0000 mg | ORAL_TABLET | Freq: Once | ORAL | Status: AC

## 2023-08-06 MED ORDER — SODIUM CHLORIDE 0.9% FLUSH
10.0000 mL | Freq: Once | INTRAVENOUS | Status: AC
  Administered 2023-08-06: 10 mL via INTRAVENOUS

## 2023-08-06 MED ORDER — CHLORHEXIDINE GLUCONATE 0.12 % MT SOLN
OROMUCOSAL | Status: AC
  Administered 2023-08-06: 15 mL via OROMUCOSAL
  Filled 2023-08-06: qty 15

## 2023-08-06 MED ORDER — HYDROMORPHONE HCL 1 MG/ML IJ SOLN
INTRAMUSCULAR | Status: AC
  Filled 2023-08-06: qty 1

## 2023-08-06 MED ORDER — FENTANYL CITRATE (PF) 100 MCG/2ML IJ SOLN
INTRAMUSCULAR | Status: AC
  Administered 2023-08-06: 50 ug via INTRAVENOUS
  Filled 2023-08-06: qty 2

## 2023-08-06 MED ORDER — ACETAMINOPHEN 10 MG/ML IV SOLN
1000.0000 mg | Freq: Once | INTRAVENOUS | Status: AC
  Administered 2023-08-06: 1000 mg via INTRAVENOUS

## 2023-08-06 MED ORDER — HYDROMORPHONE HCL 1 MG/ML IJ SOLN
0.5000 mg | INTRAMUSCULAR | Status: AC
  Administered 2023-08-06 (×3): 0.5 mg via INTRAVENOUS

## 2023-08-06 MED ORDER — FENTANYL CITRATE (PF) 250 MCG/5ML IJ SOLN
INTRAMUSCULAR | Status: DC | PRN
  Administered 2023-08-06 (×4): 50 ug via INTRAVENOUS

## 2023-08-06 MED ORDER — FENTANYL CITRATE (PF) 100 MCG/2ML IJ SOLN
INTRAMUSCULAR | Status: AC
  Filled 2023-08-06: qty 2

## 2023-08-06 MED ORDER — DEXMEDETOMIDINE HCL IN NACL 200 MCG/50ML IV SOLN
INTRAVENOUS | Status: DC | PRN
  Administered 2023-08-06: 12 ug via INTRAVENOUS

## 2023-08-06 MED ORDER — CELECOXIB 200 MG PO CAPS: 200.0000 mg | ORAL_CAPSULE | Freq: Once | ORAL | Status: AC

## 2023-08-06 MED ORDER — PROPOFOL 10 MG/ML IV BOLUS
INTRAVENOUS | Status: DC | PRN
  Administered 2023-08-06: 180 mg via INTRAVENOUS

## 2023-08-06 MED ORDER — ORAL CARE MOUTH RINSE: 15.0000 mL | Freq: Once | OROMUCOSAL | Status: AC

## 2023-08-06 MED ORDER — CEFAZOLIN SODIUM-DEXTROSE 1-4 GM/50ML-% IV SOLN
1.0000 g | Freq: Three times a day (TID) | INTRAVENOUS | Status: DC
  Administered 2023-08-06 – 2023-08-09 (×8): 1 g via INTRAVENOUS
  Filled 2023-08-06 (×8): qty 50

## 2023-08-06 MED ORDER — LIDOCAINE 2% (20 MG/ML) 5 ML SYRINGE
INTRAMUSCULAR | Status: DC | PRN
  Administered 2023-08-06: 30 mg via INTRAVENOUS

## 2023-08-06 MED ORDER — MIDAZOLAM HCL 2 MG/2ML IJ SOLN
INTRAMUSCULAR | Status: AC
Start: 2023-08-06 — End: ?
  Filled 2023-08-06: qty 2

## 2023-08-06 SURGICAL SUPPLY — 56 items
BAG COUNTER SPONGE SURGICOUNT (BAG) ×2 IMPLANT
BAG SPNG CNTER NS LX DISP (BAG) ×1
BNDG CMPR 5X3 KNIT ELC UNQ LF (GAUZE/BANDAGES/DRESSINGS)
BNDG CMPR 9X4 STRL LF SNTH (GAUZE/BANDAGES/DRESSINGS)
BNDG CMPR STD VLCR NS LF 5.8X4 (GAUZE/BANDAGES/DRESSINGS) ×4
BNDG COHESIVE 1X5 TAN STRL LF (GAUZE/BANDAGES/DRESSINGS) IMPLANT
BNDG ELASTIC 3INX 5YD STR LF (GAUZE/BANDAGES/DRESSINGS) ×2 IMPLANT
BNDG ELASTIC 4X5.8 VLCR NS LF (GAUZE/BANDAGES/DRESSINGS) IMPLANT
BNDG ELASTIC 4X5.8 VLCR STR LF (GAUZE/BANDAGES/DRESSINGS) ×2 IMPLANT
BNDG ESMARK 4X9 LF (GAUZE/BANDAGES/DRESSINGS) ×2 IMPLANT
BNDG GAUZE DERMACEA FLUFF 4 (GAUZE/BANDAGES/DRESSINGS) ×2 IMPLANT
BNDG GZE DERMACEA 4 6PLY (GAUZE/BANDAGES/DRESSINGS) ×4
CORD BIPOLAR FORCEPS 12FT (ELECTRODE) ×2 IMPLANT
COVER SURGICAL LIGHT HANDLE (MISCELLANEOUS) ×2 IMPLANT
CUFF TOURN SGL QUICK 18X4 (TOURNIQUET CUFF) ×2 IMPLANT
DRAIN PENROSE 12X.25 LTX STRL (MISCELLANEOUS) IMPLANT
DRAPE OEC MINIVIEW 54X84 (DRAPES) IMPLANT
DRAPE SURG 17X23 STRL (DRAPES) ×2 IMPLANT
DRAPE XRAY CASSETTE 23X24 (DRAPES) IMPLANT
DRSG ADAPTIC 3X8 NADH LF (GAUZE/BANDAGES/DRESSINGS) ×2 IMPLANT
GAUZE SPONGE 4X4 12PLY STRL (GAUZE/BANDAGES/DRESSINGS) ×2 IMPLANT
GAUZE XEROFORM 1X8 LF (GAUZE/BANDAGES/DRESSINGS) ×2 IMPLANT
GAUZE XEROFORM 5X9 LF (GAUZE/BANDAGES/DRESSINGS) IMPLANT
GLOVE BIOGEL PI IND STRL 7.5 (GLOVE) IMPLANT
GLOVE BIOGEL PI IND STRL 8.5 (GLOVE) ×2 IMPLANT
GLOVE SURG ORTHO 8.0 STRL STRW (GLOVE) ×2 IMPLANT
GOWN STRL REUS W/ TWL LRG LVL3 (GOWN DISPOSABLE) ×6 IMPLANT
GOWN STRL REUS W/ TWL XL LVL3 (GOWN DISPOSABLE) ×2 IMPLANT
GOWN STRL REUS W/TWL LRG LVL3 (GOWN DISPOSABLE) ×1
GOWN STRL REUS W/TWL XL LVL3 (GOWN DISPOSABLE) ×1
HANDPIECE INTERPULSE COAX TIP (DISPOSABLE) ×1
K-WIRE DBL TROCAR .045X4 (WIRE) ×4
KIT BASIN OR (CUSTOM PROCEDURE TRAY) ×2 IMPLANT
KIT TURNOVER KIT B (KITS) ×2 IMPLANT
KWIRE DBL TROCAR .045X4 (WIRE) IMPLANT
MANIFOLD NEPTUNE II (INSTRUMENTS) ×2 IMPLANT
NS IRRIG 1000ML POUR BTL (IV SOLUTION) ×2 IMPLANT
PACK ORTHO EXTREMITY (CUSTOM PROCEDURE TRAY) ×2 IMPLANT
PAD ARMBOARD 7.5X6 YLW CONV (MISCELLANEOUS) ×4 IMPLANT
PAD CAST 3X4 CTTN HI CHSV (CAST SUPPLIES) IMPLANT
PAD CAST 4YDX4 CTTN HI CHSV (CAST SUPPLIES) ×2 IMPLANT
PADDING CAST COTTON 3X4 STRL (CAST SUPPLIES) ×1
PADDING CAST COTTON 4X4 STRL (CAST SUPPLIES) ×1
SET CYSTO W/LG BORE CLAMP LF (SET/KITS/TRAYS/PACK) IMPLANT
SET HNDPC FAN SPRY TIP SCT (DISPOSABLE) IMPLANT
SPLINT FIBERGLASS 3X35 (CAST SUPPLIES) IMPLANT
SUT CHROMIC 5 0 PS 3 (SUTURE) IMPLANT
SUT MNCRL AB 3-0 PS2 18 (SUTURE) IMPLANT
SUT PROLENE 2 0 FS (SUTURE) IMPLANT
SUT PROLENE 3 0 PS 2 (SUTURE) IMPLANT
SUT PROLENE 4 0 PS 2 18 (SUTURE) IMPLANT
TOWEL GREEN STERILE (TOWEL DISPOSABLE) ×2 IMPLANT
TUBE CONNECTING 12X1/4 (SUCTIONS) ×2 IMPLANT
UNDERPAD 30X36 HEAVY ABSORB (UNDERPADS AND DIAPERS) ×2 IMPLANT
WATER STERILE IRR 1000ML POUR (IV SOLUTION) ×2 IMPLANT
YANKAUER SUCT BULB TIP NO VENT (SUCTIONS) ×2 IMPLANT
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
## 2023-08-06 NOTE — Transfer of Care (Signed)
Immediate Anesthesia Transfer of Care Note  Patient: Julia Owens  Procedure(s) Performed: IRRIGATION AND DEBRIDEMENT HAND AND ELBOW; PINNING OF FINGERS (Left)  Patient Location: PACU  Anesthesia Type:General  Level of Consciousness: awake and alert   Airway & Oxygen Therapy: Patient Spontanous Breathing  Post-op Assessment: Report given to RN and Post -op Vital signs reviewed and stable  Post vital signs: Reviewed and stable  Last Vitals:  Vitals Value Taken Time  BP 166/103   Temp    Pulse 115 08/06/23 1041  Resp 13 08/06/23 1041  SpO2 100 % 08/06/23 1041  Vitals shown include unfiled device data.  Last Pain:  Vitals:   08/06/23 0652  TempSrc: Oral  PainSc:          Complications: No notable events documented.

## 2023-08-06 NOTE — Plan of Care (Signed)
  Problem: Clinical Measurements: Goal: Will remain free from infection Outcome: Progressing   Problem: Nutrition: Goal: Adequate nutrition will be maintained Outcome: Progressing   Problem: Elimination: Goal: Will not experience complications related to bowel motility Outcome: Progressing Goal: Will not experience complications related to urinary retention Outcome: Progressing   Problem: Pain Management: Goal: General experience of comfort will improve Outcome: Progressing   Problem: Safety: Goal: Ability to remain free from injury will improve Outcome: Progressing   Problem: Skin Integrity: Goal: Risk for impaired skin integrity will decrease Outcome: Progressing
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
## 2023-08-06 NOTE — Progress Notes (Signed)
PT SEEN/EXAMINED THIS AM R/B/A DISCUSSED WITH PT IN HOSPITAL.  PT VOICED UNDERSTANDING OF PLAN CONSENT SIGNED DAY OF SURGERY PT SEEN AND EXAMINED PRIOR TO OPERATIVE PROCEDURE/DAY OF SURGERY SITE MARKED. QUESTIONS ANSWERED WILL REMAIN AN INPATIENT FOLLOWING SURGERY   WE ARE PLANNING SURGERY FOR YOUR UPPER EXTREMITY. THE RISKS AND BENEFITS OF SURGERY INCLUDE BUT NOT LIMITED TO BLEEDING INFECTION, DAMAGE TO NEARBY NERVES ARTERIES TENDONS, FAILURE OF SURGERY TO ACCOMPLISH ITS INTENDED GOALS, PERSISTENT SYMPTOMS AND NEED FOR FURTHER SURGICAL INTERVENTION. WITH THIS IN MIND WE WILL PROCEED. I HAVE DISCUSSED WITH THE PATIENT THE PRE AND POSTOPERATIVE REGIMEN AND THE DOS AND DON'TS. PT VOICED UNDERSTANDING AND INFORMED CONSENT SIGNED.

## 2023-08-06 NOTE — Anesthesia Procedure Notes (Signed)
Procedure Name: Intubation Date/Time: 08/06/2023 7:57 AM  Performed by: Gwenyth Allegra, CRNAPre-anesthesia Checklist: Patient identified, Emergency Drugs available, Suction available, Patient being monitored and Timeout performed Patient Re-evaluated:Patient Re-evaluated prior to induction Oxygen Delivery Method: Circle system utilized Preoxygenation: Pre-oxygenation with 100% oxygen Induction Type: Rapid sequence Laryngoscope Size: Mac and 3 Grade View: Grade I Tube type: Oral Tube size: 7.0 mm Number of attempts: 1 Airway Equipment and Method: Stylet Placement Confirmation: ETT inserted through vocal cords under direct vision, positive ETCO2 and breath sounds checked- equal and bilateral Secured at: 21 cm Tube secured with: Tape Dental Injury: Teeth and Oropharynx as per pre-operative assessment

## 2023-08-06 NOTE — Anesthesia Postprocedure Evaluation (Signed)
Anesthesia Post Note  Patient: Loise F Trembath  Procedure(s) Performed: IRRIGATION AND DEBRIDEMENT HAND AND ELBOW; PINNING OF FINGERS (Left)     Patient location during evaluation: PACU Anesthesia Type: General Level of consciousness: awake and alert Pain management: pain level controlled Vital Signs Assessment: post-procedure vital signs reviewed and stable Respiratory status: spontaneous breathing, nonlabored ventilation, respiratory function stable and patient connected to nasal cannula oxygen Cardiovascular status: blood pressure returned to baseline and stable Postop Assessment: no apparent nausea or vomiting Anesthetic complications: no   No notable events documented.  Last Vitals:  Vitals:   08/06/23 1230 08/06/23 1311  BP: (!) 147/94 137/84  Pulse: (!) 116 (!) 119  Resp: 13   Temp: 37 C 36.7 C  SpO2: 95% 96%    Last Pain:  Vitals:   08/06/23 1311  TempSrc: Oral  PainSc:                  Nelle Don Collen Vincent

## 2023-08-07 ENCOUNTER — Encounter (HOSPITAL_COMMUNITY): Payer: Self-pay | Admitting: Orthopedic Surgery

## 2023-08-07 NOTE — Progress Notes (Signed)
PT SEEN/EXAMINED EATING BREAKFAST PAIN CONTROLLED CONTINUE INPATIENT CARE FOR IV ABX/PAIN MEDICATION ANTICIPATE D/C HOME IN AM PT VOICED UNDERSTANDING OF PLAN

## 2023-08-07 NOTE — Plan of Care (Signed)
  Problem: Education: Goal: Knowledge of General Education information will improve Description: Including pain rating scale, medication(s)/side effects and non-pharmacologic comfort measures Outcome: Progressing   Problem: Health Behavior/Discharge Planning: Goal: Ability to manage health-related needs will improve Outcome: Progressing   Problem: Clinical Measurements: Goal: Ability to maintain clinical measurements within normal limits will improve Outcome: Progressing Goal: Will remain free from infection Outcome: Progressing Goal: Diagnostic test results will improve Outcome: Progressing Goal: Respiratory complications will improve Outcome: Progressing Goal: Cardiovascular complication will be avoided Outcome: Progressing   Problem: Activity: Goal: Risk for activity intolerance will decrease Outcome: Progressing   Problem: Nutrition: Goal: Adequate nutrition will be maintained Outcome: Progressing   Problem: Pain Management: Goal: General experience of comfort will improve Outcome: Progressing   Problem: Safety: Goal: Ability to remain free from injury will improve Outcome: Progressing   Problem: Elimination: Goal: Will not experience complications related to bowel motility Outcome: Progressing Goal: Will not experience complications related to urinary retention Outcome: Progressing

## 2023-08-08 MED ORDER — OXYCODONE-ACETAMINOPHEN 10-325 MG PO TABS: 1.0000 | ORAL_TABLET | Freq: Four times a day (QID) | ORAL | 0 refills | Status: AC | PRN

## 2023-08-08 MED ORDER — CEPHALEXIN 500 MG PO CAPS: 500.0000 mg | ORAL_CAPSULE | Freq: Four times a day (QID) | ORAL | 0 refills | Status: AC

## 2023-08-08 NOTE — Final Progress Note (Signed)
PT SEEN/EXAMINED PT STATES SHE IS NOT READY TO GO HOME, HOME NOT READY PT WILL BE DISCHARGED IN AM ALL RX DONE WILL F/U IN OFFICE ON FRIDAY
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
## 2023-08-08 NOTE — Discharge Instructions (Signed)
KEEP BANDAGE CLEAN AND DRY CALL OFFICE FOR F/U APPT [id number] F/U in 4 days KEEP HAND ELEVATED ABOVE HEART OK TO APPLY ICE TO OPERATIVE AREA CONTACT OFFICE IF ANY WORSENING PAIN OR CONCERNS.

## 2023-08-08 NOTE — Plan of Care (Signed)
  Problem: Education: Goal: Knowledge of General Education information will improve Description Including pain rating scale, medication(s)/side effects and non-pharmacologic comfort measures Outcome: Progressing   Problem: Health Behavior/Discharge Planning: Goal: Ability to manage health-related needs will improve Outcome: Progressing   
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
## 2023-08-09 NOTE — Progress Notes (Signed)
Patient feels good this morning. Per pt she had a BM this morning. Discharge instructions given. Patient verbalized understanding and all questions were answered.

## 2023-08-09 NOTE — Plan of Care (Signed)

## 2023-08-10 ENCOUNTER — Other Ambulatory Visit (HOSPITAL_COMMUNITY): Payer: Self-pay

## 2023-08-10 MED ORDER — OXYCODONE-ACETAMINOPHEN 10-325 MG PO TABS
1.0000 | ORAL_TABLET | Freq: Four times a day (QID) | ORAL | 0 refills | Status: DC | PRN
  Filled 2023-08-10: qty 28, 7d supply, fill #0

## 2023-08-10 MED ORDER — CEPHALEXIN 500 MG PO CAPS
500.0000 mg | ORAL_CAPSULE | Freq: Four times a day (QID) | ORAL | 0 refills | Status: DC
  Filled 2023-08-10: qty 40, 10d supply, fill #0

## 2023-08-12 DIAGNOSIS — M79602 Pain in left arm: Secondary | ICD-10-CM | POA: Diagnosis not present

## 2023-08-12 DIAGNOSIS — M79642 Pain in left hand: Secondary | ICD-10-CM | POA: Diagnosis not present

## 2023-08-15 ENCOUNTER — Encounter (HOSPITAL_COMMUNITY): Payer: Self-pay

## 2023-08-15 ENCOUNTER — Emergency Department (HOSPITAL_COMMUNITY): Payer: BC Managed Care – PPO

## 2023-08-15 ENCOUNTER — Other Ambulatory Visit: Payer: Self-pay

## 2023-08-15 ENCOUNTER — Emergency Department (HOSPITAL_COMMUNITY)
Admission: EM | Admit: 2023-08-15 | Discharge: 2023-08-15 | Disposition: A | Discharge status: Home / Self Care | Payer: BC Managed Care – PPO

## 2023-08-15 DIAGNOSIS — K5900 Constipation, unspecified: Secondary | ICD-10-CM | POA: Diagnosis not present

## 2023-08-15 DIAGNOSIS — R0902 Hypoxemia: Secondary | ICD-10-CM | POA: Diagnosis not present

## 2023-08-15 DIAGNOSIS — D259 Leiomyoma of uterus, unspecified: Secondary | ICD-10-CM | POA: Diagnosis not present

## 2023-08-15 DIAGNOSIS — R059 Cough, unspecified: Secondary | ICD-10-CM | POA: Diagnosis not present

## 2023-08-15 DIAGNOSIS — R932 Abnormal findings on diagnostic imaging of liver and biliary tract: Secondary | ICD-10-CM | POA: Diagnosis not present

## 2023-08-15 DIAGNOSIS — R000 Tachycardia, unspecified: Secondary | ICD-10-CM | POA: Diagnosis not present

## 2023-08-15 DIAGNOSIS — I10 Essential (primary) hypertension: Secondary | ICD-10-CM | POA: Diagnosis not present

## 2023-08-15 DIAGNOSIS — R062 Wheezing: Secondary | ICD-10-CM | POA: Diagnosis not present

## 2023-08-15 DIAGNOSIS — K429 Umbilical hernia without obstruction or gangrene: Secondary | ICD-10-CM | POA: Diagnosis not present

## 2023-08-15 LAB — COMPREHENSIVE METABOLIC PANEL
ALT: 18 U/L (ref 0–44)
AST: 23 U/L (ref 15–41)
Albumin: 4 g/dL (ref 3.5–5.0)
Alkaline Phosphatase: 66 U/L (ref 38–126)
Anion gap: 12 (ref 5–15)
BUN: 8 mg/dL (ref 6–20)
CO2: 24 mmol/L (ref 22–32)
Calcium: 9.4 mg/dL (ref 8.9–10.3)
Chloride: 101 mmol/L (ref 98–111)
Creatinine, Ser: 0.73 mg/dL (ref 0.44–1.00)
GFR, Estimated: >60 mL/min (ref >60)
Glucose, Bld: 97 mg/dL (ref 70–99)
Potassium: 3.8 mmol/L (ref 3.5–5.1)
Sodium: 137 mmol/L (ref 135–145)
Total Bilirubin: 0.9 mg/dL (ref <1.2)
Total Protein: 8.5 g/dL — ABNORMAL HIGH (ref 6.5–8.1)

## 2023-08-15 LAB — CBC WITH DIFFERENTIAL/PLATELET
Abs Immature Granulocytes: 0.09 10*3/uL — ABNORMAL HIGH (ref 0.00–0.07)
Basophils Absolute: 0.1 10*3/uL (ref 0.0–0.1)
Basophils Relative: 0 %
Eosinophils Absolute: 0.1 10*3/uL (ref 0.0–0.5)
Eosinophils Relative: 0 %
HCT: 33.8 % — ABNORMAL LOW (ref 36.0–46.0)
Hemoglobin: 11.5 g/dL — ABNORMAL LOW (ref 12.0–15.0)
Immature Granulocytes: 1 %
Lymphocytes Relative: 16 %
Lymphs Abs: 2.5 10*3/uL (ref 0.7–4.0)
MCH: 27.4 pg (ref 26.0–34.0)
MCHC: 34 g/dL (ref 30.0–36.0)
MCV: 80.5 fL (ref 80.0–100.0)
Monocytes Absolute: 0.5 10*3/uL (ref 0.1–1.0)
Monocytes Relative: 4 %
Neutro Abs: 12.3 10*3/uL — ABNORMAL HIGH (ref 1.7–7.7)
Neutrophils Relative %: 79 %
Platelets: 430 10*3/uL — ABNORMAL HIGH (ref 150–400)
RBC: 4.2 MIL/uL (ref 3.87–5.11)
RDW: 13.6 % (ref 11.5–15.5)
WBC: 15.5 10*3/uL — ABNORMAL HIGH (ref 4.0–10.5)
nRBC: 0 % (ref 0.0–0.2)

## 2023-08-15 LAB — LIPASE, BLOOD: Lipase: 24 U/L (ref 11–51)

## 2023-08-15 MED ORDER — IOHEXOL 300 MG/ML  SOLN
100.0000 mL | Freq: Once | INTRAMUSCULAR | Status: AC | PRN
  Administered 2023-08-15: 100 mL via INTRAVENOUS

## 2023-08-15 MED ORDER — IPRATROPIUM-ALBUTEROL 0.5-2.5 (3) MG/3ML IN SOLN
3.0000 mL | Freq: Once | RESPIRATORY_TRACT | Status: AC
  Administered 2023-08-15: 3 mL via RESPIRATORY_TRACT
  Filled 2023-08-15: qty 3

## 2023-08-15 MED ORDER — MINERAL OIL RE ENEM
1.0000 | ENEMA | Freq: Once | RECTAL | Status: AC
  Administered 2023-08-15: 1 via RECTAL
  Filled 2023-08-15: qty 1

## 2023-08-15 MED ORDER — MAGNESIUM HYDROXIDE 400 MG/5ML PO SUSP
15.0000 mL | Freq: Once | ORAL | Status: DC
  Filled 2023-08-15: qty 30

## 2023-08-15 MED ORDER — KETOROLAC TROMETHAMINE 15 MG/ML IJ SOLN
15.0000 mg | Freq: Once | INTRAMUSCULAR | Status: AC
  Administered 2023-08-15: 15 mg via INTRAVENOUS
  Filled 2023-08-15: qty 1

## 2023-08-15 MED ORDER — POLYETHYLENE GLYCOL 3350 17 G PO PACK
34.0000 g | PACK | Freq: Every day | ORAL | Status: DC
  Administered 2023-08-15: 34 g via ORAL
  Filled 2023-08-15: qty 2

## 2023-08-15 MED ORDER — SODIUM CHLORIDE 0.9 % IV BOLUS
1000.0000 mL | Freq: Once | INTRAVENOUS | Status: AC
  Administered 2023-08-15: 1000 mL via INTRAVENOUS

## 2023-08-15 MED ORDER — DICYCLOMINE HCL 20 MG PO TABS: 20.0000 mg | ORAL_TABLET | Freq: Two times a day (BID) | ORAL | 0 refills | Status: DC

## 2023-08-15 MED ORDER — ALBUTEROL SULFATE HFA 108 (90 BASE) MCG/ACT IN AERS: 1.0000 | INHALATION_SPRAY | Freq: Four times a day (QID) | RESPIRATORY_TRACT | 0 refills | Status: AC | PRN

## 2023-08-15 MED ORDER — MAGNESIUM HYDROXIDE 400 MG/5ML PO SUSP
15.0000 mL | Freq: Once | ORAL | Status: AC
  Administered 2023-08-15: 15 mL via ORAL

## 2023-08-15 NOTE — ED Provider Notes (Signed)
CT scan abdomen pelvis showed no bowel obstruction.  Constipation but no fecal impaction.  Masslike structure to the left ovary incidentally found as well.  She understands to follow-up with her OB/GYN to get ultrasound to further evaluate.  Overall she would like to try some MiraLAX and try to have a bowel movement prior to leaving.  Will give her MiraLAX and milk of magnesia.  She is ready been given an enema as well as attempt at digital disimpaction with prior ED doctor.  Overall we will send her in [place] for any bowel spasms.  I think she will be stable for discharge and will have her try these medications prior to leaving.  Tolerated medications well.  Eating and drinking well.  Overall she understands MiraLAX titration.  She understands OB follow-up.  She understands return precautions.  Discharged in good condition.  This chart was dictated using voice recognition software.  Despite best efforts to proofread,  errors can occur which can change the documentation meaning.    Virgina Norfolk, DO 08/15/23 2239

## 2023-08-15 NOTE — ED Notes (Signed)
Pt asking for water. Explained to patient that we are waiting for a provider to sign up for the patient and we need permission for the provider to be able to give her water. Offered patient a mouth swab while we await the provider. Pt agreeable. Pt given a mouth swab and scant amount of water to dip the mouth swab.

## 2023-08-15 NOTE — ED Triage Notes (Signed)
Per EMS  No Bowel Movement Ongoing for 1 week Yesterday round balls for BM Tired otc meds with no relief Fleets Recent surgery  On oxycodone No stool softer Cough/Congestion Since last Wednesday  Left arm pain Ongoing from surgery

## 2023-08-15 NOTE — Discharge Instructions (Signed)
Recommend 3-4 capfuls of MiraLAX in 20 to 30 ounces of water daily and titrate to desired effect.  I recommend stopping all narcotic pain medicine which could be contributing to your constipation.  Please follow-up with your OB/GYN to get an ultrasound to further evaluate masslike structure in your left ovary area as we discussed.

## 2023-08-15 NOTE — ED Provider Notes (Signed)
[place] EMERGENCY DEPARTMENT AT [facility] Provider Note   CSN: [id number] Arrival date & time: 08/15/23  1215     History {Add pertinent medical, surgical, social history, OB history to HPI:1} Chief Complaint  Patient presents with   Constipation   Cough   Arm Pain    left    Thea F Boening is a 37 y.o. female.  37 year old female with recent surgery for debridement for her left elbow and fingers at [facility] after [facility] presenting to the emergency department today with primary complaints today of constipation.  The patient states that she has been having some lower abdominal discomfort and constipation.  She has been taking oxycodone for pain.  She states that she did follow-up with the hand surgeons office on Friday and had her dressings changed.  The plan is for her to follow-up again on this Friday with her hand surgeon for reevaluation.  The patient denies any fevers.  She states that she has had decreased appetite because when she does eat it makes her feel like she needs to go to the bathroom.  She states that yesterday she did try an enema yesterday.  She was able to have a small bowel movement and this did improve her symptoms.  She states this morning she tried to go again and feels that she has stool that will not pass.  She came to the ER at that time for further evaluation.  The patient's her pain in her arm has been constant since he left the hospital.  She is on Keflex for this.  She states that additional complaint is that she has been coughing and wheezing.  She denies any history of asthma.  Fevers with this.  Denies any productive cough.   Constipation Cough Arm Pain       Home Medications Prior to Admission medications   Medication Sig Start Date End Date Taking? Authorizing Provider  cephALEXin (KEFLEX) 500 MG capsule Take 1 capsule (500 mg total) by mouth 4 (four) times daily for 10 days. 08/08/23 08/18/23  Bradly Bienenstock, MD  cephALEXin  (KEFLEX) 500 MG capsule Take 1 capsule (500 mg total) by mouth 4 (four) times daily for 10 days 08/10/23     oxyCODONE-acetaminophen (PERCOCET) 10-325 MG tablet Take 1 tablet by mouth every 6 (six) hours as needed for up to 7 days for pain. 08/08/23 08/15/23  Bradly Bienenstock, MD  oxyCODONE-acetaminophen (PERCOCET) 10-325 MG tablet Take 1 tablet by mouth every 6 (six) hours as needed for up to 7 days for pain 08/08/23     diphenhydrAMINE (BENADRYL) 25 MG tablet Take 1 tablet (25 mg total) by mouth every 6 (six) hours. Patient not taking: Reported on 03/11/2016 12/06/15 03/11/16  Mady Gemma, PA-C  loratadine (CLARITIN) 10 MG tablet Take 1 tablet (10 mg total) by mouth daily. Patient not taking: Reported on 03/11/2016 12/06/15 03/11/16  Mady Gemma, PA-C      Allergies    Flagyl [metronidazole]    Review of Systems   Review of Systems  Respiratory:  Positive for cough.   Gastrointestinal:  Positive for constipation.  All other systems reviewed and are negative.   Physical Exam Updated Vital Signs BP (!) 150/101 (BP Location: Right Arm)   Pulse (!) 117   Temp 98.3 F (36.8 C) (Oral)   Resp 18   Ht 5\' 2"  (1.575 m)   Wt 90.7 kg   LMP 07/29/2023 (Approximate)   SpO2 100%   BMI 36.58  kg/m  Physical Exam Vitals and nursing note reviewed.   Gen: NAD Eyes: PERRL, EOMI HEENT: no oropharyngeal swelling Neck: trachea midline Resp: Wheezes throughout all lung fields Card: Tachycardic in low 100s, no murmurs, rubs, or gallops Abd: nontender, nondistended Rectal: Chaperoned by female nurse for nursing staff does show impacted stool in the rectal vault.  Is able to disimpact a small amount of this. Extremities: no calf tenderness, no edema Vascular: 2+ radial pulses bilaterally, 2+ DP pulses bilaterally Skin: no rashes Psyc: acting appropriately   ED Results / Procedures / Treatments   Labs (all labs ordered are listed, but only abnormal results are displayed) Labs  Reviewed - No data to display  EKG None  Radiology No results found.  Procedures Procedures  {Document cardiac monitor, telemetry assessment procedure when appropriate:1}  Medications Ordered in ED Medications  mineral oil enema 1 enema (has no administration in time range)    ED Course/ Medical Decision Making/ A&P   {   Click here for ABCD2, HEART and other calculatorsREFRESH Note before signing :1}                              Medical Decision Making 37 year old female with recent surgery for her left arm with open fracture of her fingers as well as debridement of her left elbow presenting to the emergency department today with constipation.  The patient is passing flatus.  Suspicion for bowel obstruction is low at this time.  She reports that her symptoms did partially resolve yesterday with enema administration.  We will try an enema for the patient here.  She does have some mild wheezing here on exam.  Will obtain chest x-ray for this.  The patient's heart rate was elevated on arrival.  This is improved to the low 100s and high 90s on exam here today.  She is otherwise well-appearing.  Compliant with her hand and arm is relatively constant and has not changed since he is left the hospital.  Suspicion for sepsis is relatively low at this time.  She is afebrile here.  She does not have significant improvement with the enema will consider further workup with labs and possible will hold off at this time.  Patient was unable to have bowel movement after the enema.  CT scan is ordered with Gastrografin.  This is pending at the time of signout.  The patient is have a leukocytosis but this is downtrending compared to her baseline.  Her chest x-ray interpreted by me shows no acute infiltrates, no pulmonary edema, no pneumothorax.  She was feeling better after the DuoNeb treatment here.  Amount and/or Complexity of Data Reviewed Labs: ordered. Radiology: ordered.  Risk OTC  drugs. Prescription drug management.     {Document critical care time when appropriate:1} {Document review of labs and clinical decision tools ie heart score, Chads2Vasc2 etc:1}  {Document your independent review of radiology images, and any outside records:1} {Document your discussion with family members, caretakers, and with consultants:1} {Document social determinants of health affecting pt's care:1} {Document your decision making why or why not admission, treatments were needed:1} Final Clinical Impression(s) / ED Diagnoses Final diagnoses:  None    Rx / DC Orders ED Discharge Orders     None

## 2023-08-18 DIAGNOSIS — M79642 Pain in left hand: Secondary | ICD-10-CM | POA: Diagnosis not present

## 2023-08-19 DIAGNOSIS — S56902D Unspecified injury of unspecified muscles, fascia and tendons at forearm level, left arm, subsequent encounter: Secondary | ICD-10-CM | POA: Diagnosis not present

## 2023-08-19 DIAGNOSIS — M79642 Pain in left hand: Secondary | ICD-10-CM | POA: Diagnosis not present

## 2023-08-22 DIAGNOSIS — M79642 Pain in left hand: Secondary | ICD-10-CM | POA: Diagnosis not present

## 2023-08-24 DIAGNOSIS — M79642 Pain in left hand: Secondary | ICD-10-CM | POA: Diagnosis not present

## 2023-08-29 DIAGNOSIS — M79642 Pain in left hand: Secondary | ICD-10-CM | POA: Diagnosis not present

## 2023-08-31 DIAGNOSIS — F418 Other specified anxiety disorders: Secondary | ICD-10-CM | POA: Diagnosis not present

## 2023-08-31 DIAGNOSIS — R7303 Prediabetes: Secondary | ICD-10-CM | POA: Diagnosis not present

## 2023-08-31 DIAGNOSIS — Z0000 Encounter for general adult medical examination without abnormal findings: Secondary | ICD-10-CM | POA: Diagnosis not present

## 2023-08-31 DIAGNOSIS — G479 Sleep disorder, unspecified: Secondary | ICD-10-CM | POA: Diagnosis not present

## 2023-09-01 DIAGNOSIS — M79642 Pain in left hand: Secondary | ICD-10-CM | POA: Diagnosis not present

## 2023-09-02 DIAGNOSIS — M79642 Pain in left hand: Secondary | ICD-10-CM | POA: Diagnosis not present

## 2023-09-02 DIAGNOSIS — S56902D Unspecified injury of unspecified muscles, fascia and tendons at forearm level, left arm, subsequent encounter: Secondary | ICD-10-CM | POA: Diagnosis not present

## 2023-09-05 DIAGNOSIS — M79642 Pain in left hand: Secondary | ICD-10-CM | POA: Diagnosis not present

## 2023-09-06 DIAGNOSIS — M79642 Pain in left hand: Secondary | ICD-10-CM | POA: Diagnosis not present

## 2023-09-08 DIAGNOSIS — S56902D Unspecified injury of unspecified muscles, fascia and tendons at forearm level, left arm, subsequent encounter: Secondary | ICD-10-CM | POA: Diagnosis not present

## 2023-09-08 DIAGNOSIS — M79642 Pain in left hand: Secondary | ICD-10-CM | POA: Diagnosis not present

## 2023-09-12 DIAGNOSIS — M79642 Pain in left hand: Secondary | ICD-10-CM | POA: Diagnosis not present

## 2023-09-15 ENCOUNTER — Encounter: Payer: Self-pay | Admitting: Podiatry

## 2023-09-15 ENCOUNTER — Ambulatory Visit: Payer: BC Managed Care – PPO | Admitting: Podiatry

## 2023-09-15 DIAGNOSIS — B351 Tinea unguium: Secondary | ICD-10-CM | POA: Diagnosis not present

## 2023-09-15 DIAGNOSIS — S56902D Unspecified injury of unspecified muscles, fascia and tendons at forearm level, left arm, subsequent encounter: Secondary | ICD-10-CM | POA: Diagnosis not present

## 2023-09-15 DIAGNOSIS — M79642 Pain in left hand: Secondary | ICD-10-CM | POA: Diagnosis not present

## 2023-09-15 NOTE — Progress Notes (Signed)
Subjective: Chief Complaint  Patient presents with   Ingrown Toenail    RM#13 Follow up on ingrown toe nail removal bilateral second toe.    37 year old female  presents the office for follow-up evaluation of nail fungus to both of her second digit toenails.  She feels the medication is working, she is continue the topical medication through [place] for nail fungus.  No swelling redness or drainage sites.  No ulcerations.  Objective: AAO x3, NAD DP/PT pulses palpable bilaterally, CRT less than 3 seconds Bilateral second toenails remain to be hypertrophic, dystrophic with brown discoloration but appear to be somewhat improved with the color is a bit more clear on the proximal nail fold but still dystrophic.  There is continue to improve.  There is no extension of hyperpigmentation in the surrounding skin.  Overall the color is improving.  There is no pain associated tenderness swelling or redness or any drainage.  There is no open lesions.  No pain with calf compression, swelling, warmth, erythema  Assessment: Onychodystrophy, onychomycosis bilateral second digit toenails  Plan: -All treatment options discussed with the patient including all alternatives, risks, complications.  -As a courtesy I sharply debrided the nails with any complications or bleeding. I refilled the compound cream through [facility] for nail fungus.  Fax new order -Improvement consider oral medication, nail removal if needed.  Vivi Barrack DPM

## 2023-09-26 DIAGNOSIS — M79642 Pain in left hand: Secondary | ICD-10-CM | POA: Diagnosis not present

## 2024-03-15 ENCOUNTER — Ambulatory Visit: Payer: MEDICAID | Admitting: Podiatry

## 2024-03-19 ENCOUNTER — Ambulatory Visit (INDEPENDENT_AMBULATORY_CARE_PROVIDER_SITE_OTHER): Payer: MEDICAID | Admitting: Podiatry

## 2024-03-19 DIAGNOSIS — B351 Tinea unguium: Secondary | ICD-10-CM

## 2024-03-20 ENCOUNTER — Other Ambulatory Visit: Payer: Self-pay

## 2024-03-20 LAB — TIQ-NTM

## 2024-03-21 NOTE — Progress Notes (Signed)
 Subjective: Chief Complaint  Patient presents with   Nail Problem    RM#13 Follow up on bilateral nail fungus patient states still the same no major changes.    38 year old female  presents the office for follow-up evaluation of nail fungus to both of her second digit toenails.  She has been using the topical medication.  She has seen some improvement has not changed much.  She has not any pain currently.  No swelling redness or any drainage.  No other concerns.    Objective: AAO x3, NAD DP/PT pulses palpable bilaterally, CRT less than 3 seconds Bilateral second toenails remain to be hypertrophic, dystrophic with brown discoloration but appear to be somewhat improved of the majority still remains unchanged.  There is no surrounding erythema, ascending cellulitis but there is no drainage or pus or signs of infection.  No open lesions. No pain with calf compression, swelling, warmth, erythema  Assessment: Onychodystrophy, onychomycosis bilateral second digit toenails  Plan: -All treatment options discussed with the patient including all alternatives, risks, complications.  -As a courtesy I sharply debrided the nails with any complications or bleeding.  I sent the notes for culture and pathology to [facility].  Discussed treatment option including oral medications but will await results of the culture before proceeding with she continue topical medication for now. -Improvement consider oral medication, nail removal if needed.  Julia Owens DPM

## 2024-04-25 LAB — FUNGUS CULTURE W SMEAR

## 2024-04-25 LAB — CULT, FUNGUS, SKIN,HAIR,NAIL W/KOH
CULTURE:: NO GROWTH
MICRO NUMBER:: 16634081
SMEAR:: NONE SEEN
SPECIMEN QUALITY:: ADEQUATE

## 2024-04-27 ENCOUNTER — Ambulatory Visit: Payer: Self-pay | Admitting: Podiatry

## 2024-06-11 ENCOUNTER — Other Ambulatory Visit: Payer: Self-pay | Admitting: Nurse Practitioner

## 2024-06-11 DIAGNOSIS — Z1231 Encounter for screening mammogram for malignant neoplasm of breast: Secondary | ICD-10-CM

## 2024-06-29 ENCOUNTER — Ambulatory Visit
Admission: RE | Admit: 2024-06-29 | Discharge: 2024-06-29 | Disposition: A | Discharge status: Home / Self Care | Source: Ambulatory Visit | Attending: Nurse Practitioner | Admitting: Nurse Practitioner

## 2024-06-29 DIAGNOSIS — Z1231 Encounter for screening mammogram for malignant neoplasm of breast: Secondary | ICD-10-CM

## 2024-09-14 ENCOUNTER — Other Ambulatory Visit: Payer: Self-pay | Admitting: Nurse Practitioner

## 2024-09-14 DIAGNOSIS — D219 Benign neoplasm of connective and other soft tissue, unspecified: Secondary | ICD-10-CM

## 2024-11-28 ENCOUNTER — Other Ambulatory Visit
